# Patient Record
Sex: Male | Born: 1960 | Race: Black or African American | Hispanic: No | Marital: Single | Smoking: Former smoker
Health system: Southern US, Community
[De-identification: ages and names within clinical notes are randomized; demographics above are authoritative.]

## PROBLEM LIST (undated history)

## (undated) DIAGNOSIS — I10 Essential (primary) hypertension: Secondary | ICD-10-CM

## (undated) DIAGNOSIS — R0600 Dyspnea, unspecified: Secondary | ICD-10-CM

## (undated) DIAGNOSIS — J449 Chronic obstructive pulmonary disease, unspecified: Secondary | ICD-10-CM

## (undated) DIAGNOSIS — R569 Unspecified convulsions: Secondary | ICD-10-CM

## (undated) DIAGNOSIS — G43909 Migraine, unspecified, not intractable, without status migrainosus: Secondary | ICD-10-CM

## (undated) DIAGNOSIS — G9389 Other specified disorders of brain: Secondary | ICD-10-CM

## (undated) DIAGNOSIS — J45909 Unspecified asthma, uncomplicated: Secondary | ICD-10-CM

## (undated) DIAGNOSIS — E785 Hyperlipidemia, unspecified: Secondary | ICD-10-CM

## (undated) HISTORY — DX: Other specified disorders of brain: G93.89

## (undated) HISTORY — DX: Essential (primary) hypertension: I10

## (undated) HISTORY — DX: Unspecified convulsions: R56.9

## (undated) HISTORY — PX: CARDIOVASCULAR STRESS TEST: SHX262

## (undated) HISTORY — DX: Hyperlipidemia, unspecified: E78.5

## (undated) HISTORY — DX: Migraine, unspecified, not intractable, without status migrainosus: G43.909

---

## 2000-09-16 ENCOUNTER — Emergency Department (HOSPITAL_COMMUNITY): Admission: EM | Admit: 2000-09-16 | Discharge: 2000-09-16 | Payer: Self-pay | Admitting: Emergency Medicine

## 2003-11-03 ENCOUNTER — Emergency Department (HOSPITAL_COMMUNITY): Admission: EM | Admit: 2003-11-03 | Discharge: 2003-11-03 | Payer: Self-pay | Admitting: Emergency Medicine

## 2004-09-20 ENCOUNTER — Encounter: Admission: RE | Admit: 2004-09-20 | Discharge: 2004-09-20 | Payer: Self-pay | Admitting: Emergency Medicine

## 2004-09-22 ENCOUNTER — Ambulatory Visit (HOSPITAL_COMMUNITY): Admission: RE | Admit: 2004-09-22 | Discharge: 2004-09-22 | Payer: Self-pay | Admitting: Emergency Medicine

## 2004-12-08 ENCOUNTER — Emergency Department (HOSPITAL_COMMUNITY): Admission: EM | Admit: 2004-12-08 | Discharge: 2004-12-08 | Payer: Self-pay | Admitting: Emergency Medicine

## 2008-08-18 ENCOUNTER — Ambulatory Visit (HOSPITAL_COMMUNITY): Admission: RE | Admit: 2008-08-18 | Discharge: 2008-08-18 | Payer: Self-pay | Admitting: *Deleted

## 2010-02-10 ENCOUNTER — Ambulatory Visit: Payer: Self-pay | Admitting: Cardiology

## 2010-02-10 ENCOUNTER — Observation Stay (HOSPITAL_COMMUNITY): Admission: EM | Admit: 2010-02-10 | Discharge: 2010-02-12 | Payer: Self-pay | Admitting: Emergency Medicine

## 2010-10-08 LAB — CBC
HCT: 46.5 % (ref 39.0–52.0)
MCHC: 35.1 g/dL (ref 30.0–36.0)
Platelets: 175 10*3/uL (ref 150–400)
RDW: 13.6 % (ref 11.5–15.5)
WBC: 8.4 10*3/uL (ref 4.0–10.5)

## 2010-10-08 LAB — CK TOTAL AND CKMB (NOT AT ARMC)
Relative Index: INVALID (ref 0.0–2.5)
Total CK: 73 U/L (ref 7–232)

## 2010-10-08 LAB — DIFFERENTIAL
Lymphs Abs: 3.2 10*3/uL (ref 0.7–4.0)
Monocytes Absolute: 0.8 10*3/uL (ref 0.1–1.0)
Monocytes Relative: 10 % (ref 3–12)
Neutro Abs: 4.2 10*3/uL (ref 1.7–7.7)
Neutrophils Relative %: 50 % (ref 43–77)

## 2010-10-08 LAB — POCT CARDIAC MARKERS
CKMB, poc: <1 ng/mL — ABNORMAL LOW (ref 1.0–8.0)
Myoglobin, poc: 46.2 ng/mL (ref 12–200)

## 2010-10-08 LAB — COMPREHENSIVE METABOLIC PANEL
ALT: 16 U/L (ref 0–53)
Albumin: 3.8 g/dL (ref 3.5–5.2)
BUN: 6 mg/dL (ref 6–23)
Calcium: 9.6 mg/dL (ref 8.4–10.5)
Glucose, Bld: 90 mg/dL (ref 70–99)
Sodium: 140 mEq/L (ref 135–145)
Total Protein: 6.8 g/dL (ref 6.0–8.3)

## 2010-10-08 LAB — CARDIAC PANEL(CRET KIN+CKTOT+MB+TROPI)
CK, MB: 0.8 ng/mL (ref 0.3–4.0)
Relative Index: INVALID (ref 0.0–2.5)
Total CK: 68 U/L (ref 7–232)
Total CK: 92 U/L (ref 7–232)
Troponin I: 0.02 ng/mL (ref 0.00–0.06)

## 2010-11-07 LAB — LIPID PANEL
Cholesterol: 218 mg/dL — ABNORMAL HIGH (ref 0–200)
HDL: 35 mg/dL — ABNORMAL LOW (ref >39)
LDL Cholesterol: 147 mg/dL — ABNORMAL HIGH (ref 0–99)
Triglycerides: 181 mg/dL — ABNORMAL HIGH (ref <150)
VLDL: 28 mg/dL (ref 0–40)

## 2010-12-06 NOTE — Cardiovascular Report (Signed)
NAMEXAVIOUS, SHARRAR              ACCOUNT NO.:  [id number]   MEDICAL RECORD NO.:  [id number]          PATIENT TYPE:  OIB   LOCATION:  2899                         FACILITY:  [facility]   PHYSICIAN:  Elmore Guise., M.D.DATE OF BIRTH:  [birth year]   DATE OF PROCEDURE:  DATE OF DISCHARGE:  08/18/2008                            CARDIAC CATHETERIZATION   INDICATION FOR PROCEDURE:  Continued chest pain, family history of early  heart disease.   PROCEDURE DESCRIPTION:  The patient was brought to the cardiac cath lab.  After appropriate informed consent, he was prepped and draped in sterile  fashion.  Approximately, 10 mL of 1% lidocaine was used for local  anesthesia.  A 5-French sheath placed in the right femoral artery  without difficulty.  Coronary angiography, LV angiography, aortic root  angiography were then performed.  The patient tolerated the procedure  well and was transferred from the cardiac cath lab in stable condition.   FINDINGS:  1. Left Main:  Normal.  2. LAD:  Normal.  3. D1:  Normal.  4. Ramus intermedius:  Large vessel, normal appearing.  5. LCx:  Codominant, normal appearing.  6. RCA:  Anterior takeoff, mid 20-30% stenosis at a bend, distal RCA      and PDA showed mild luminal irregularities.  7. LV:  EF of 65-70%, no wall motion abnormalities, LVEDP is 16 mmHg.  8. Aortic root shot is normal.  No evidence of enlargement or      dissection noted.   IMPRESSION:  1. Nonobstructive coronary arteries.  2. Vigorous left ventricular systolic function, ejection fraction of      65-70%.  3. Normal-appearing aortic root.   PLAN:  At this time, I would recommend aggressive risk factor  modification as indicated.      Elmore Guise., M.D.  Electronically Signed     TWK/MEDQ  D:  08/18/2008  T:  08/19/2008  Job:  [id number]

## 2010-12-09 NOTE — Procedures (Signed)
REFERRING PHYSICIAN:  Dr. Roque Lias   CLINICAL INFORMATION:  This is a 50 year old African-American male who had  complained of numbness in his left arm and left side of his face with onset  approximately 2 months ago, the last episode occurring the night prior to  this EEG lasting for 45 seconds.   TECHNICAL DESCRIPTION:  This EEG was recorded during the awake state.  The  background activity shows low voltage fast beta activity and muscle artifact  superimposed on well-formed 10 Hz rhythms with higher amplitudes seen in the  posterior head regions bilaterally.  No focal asymmetry and no epileptiform  activity was seen on this study.   Photic stimulation was performed which did not produce a driving response in  occipital head regions.  Hyperventilation testing was performed which  produced no abnormalities.   IMPRESSION:  This is a normal EEG during the awake state without any stage  II sleep present.  There is much low voltage fast beta activity suggestive  of medication effect.      EAV:WUJW  D:  09/22/2004 17:42:23  T:  09/22/2004 20:57:37  Job #:  [id number]

## 2011-12-23 ENCOUNTER — Encounter: Payer: Self-pay | Admitting: *Deleted

## 2012-03-06 ENCOUNTER — Encounter (HOSPITAL_COMMUNITY): Payer: Self-pay | Admitting: Emergency Medicine

## 2012-03-06 ENCOUNTER — Emergency Department (HOSPITAL_COMMUNITY): Payer: Self-pay

## 2012-03-06 ENCOUNTER — Emergency Department (HOSPITAL_COMMUNITY)
Admission: EM | Admit: 2012-03-06 | Discharge: 2012-03-07 | Disposition: A | Discharge status: Home / Self Care | Payer: Self-pay | Attending: Emergency Medicine | Admitting: Emergency Medicine

## 2012-03-06 DIAGNOSIS — F17200 Nicotine dependence, unspecified, uncomplicated: Secondary | ICD-10-CM | POA: Insufficient documentation

## 2012-03-06 DIAGNOSIS — I10 Essential (primary) hypertension: Secondary | ICD-10-CM | POA: Insufficient documentation

## 2012-03-06 DIAGNOSIS — Z79899 Other long term (current) drug therapy: Secondary | ICD-10-CM | POA: Insufficient documentation

## 2012-03-06 DIAGNOSIS — R0789 Other chest pain: Secondary | ICD-10-CM

## 2012-03-06 DIAGNOSIS — R071 Chest pain on breathing: Secondary | ICD-10-CM | POA: Insufficient documentation

## 2012-03-06 DIAGNOSIS — R079 Chest pain, unspecified: Secondary | ICD-10-CM | POA: Insufficient documentation

## 2012-03-06 LAB — BASIC METABOLIC PANEL
Calcium: 9.8 mg/dL (ref 8.4–10.5)
GFR calc non Af Amer: >90 mL/min (ref >90)
Potassium: 3.5 mEq/L (ref 3.5–5.1)
Sodium: 140 mEq/L (ref 135–145)

## 2012-03-06 LAB — CBC
MCH: 34.5 pg — ABNORMAL HIGH (ref 26.0–34.0)
MCHC: 36.2 g/dL — ABNORMAL HIGH (ref 30.0–36.0)
Platelets: 177 10*3/uL (ref 150–400)
RBC: 4.84 MIL/uL (ref 4.22–5.81)

## 2012-03-06 LAB — POCT I-STAT TROPONIN I: Troponin i, poc: 0 ng/mL (ref 0.00–0.08)

## 2012-03-06 MED ORDER — FENTANYL CITRATE 0.05 MG/ML IJ SOLN
50.0000 ug | Freq: Once | INTRAMUSCULAR | Status: AC
  Administered 2012-03-06: 50 ug via INTRAVENOUS
  Filled 2012-03-06: qty 2

## 2012-03-06 NOTE — ED Notes (Signed)
Pt c/o R sided chest pain radiating through to shoulder x 2 days, pressure, pt resting at onset. Pt c/o SOB and lightheadedness with pain. Denies diaphoresis or nausea. EKG completed prior to triage

## 2012-03-06 NOTE — ED Notes (Addendum)
Pt has severe intermittent sharp colicky pains in his right upper chest. Pt rates pain 10/10. Pt leans forward and stretches his chest to help with the pain. Fentanyl ordered per pain protocol

## 2012-03-06 NOTE — ED Notes (Addendum)
Pt states he took 3 baby asa at 5 pm today. Pt states pain worsens when he lying down and taking in deep breaths. Pt reports pallitation to pain when he is sitting up or stretching his chest

## 2012-03-06 NOTE — ED Provider Notes (Addendum)
History     CSN: [id number]  Arrival date & time 03/06/12  4098   First MD Initiated Contact with Patient 03/06/12 2105      Chief Complaint  Patient presents with  . Chest Pain    (Consider location/radiation/quality/duration/timing/severity/associated sxs/prior treatment) HPI Comments: Andrew Villarreal presents for evaluation of right-sided chest pain.  He states the pain began while he was lying in bed yesterday.  He describes the sudden onset of a severe cramping that seemed to radiate into the shoulder.  The pain was relieved when he would sit upright.  Since it's onset, he describes multiple episodes of similar pain.  He denies syncope, palpitations, SOB, abd pain, NVD.  Patient is a 51 y.o. male presenting with chest pain. The history is provided by the patient. No language interpreter was used.  Chest Pain The chest pain began 2 days ago. Chest pain occurs intermittently. The chest pain is unchanged (relapsing spastic pain). At its most intense, the pain is at 8/10. The quality of the pain is described as tightness, sharp and aching ("cramping"). The pain radiates to the right shoulder. Chest pain is worsened by certain positions. Pertinent negatives for primary symptoms include no fever, no fatigue, no syncope, no shortness of breath, no cough, no palpitations, no abdominal pain, no nausea, no vomiting and no altered mental status.  Pertinent negatives for associated symptoms include no claudication, no diaphoresis, no lower extremity edema, no near-syncope, no orthopnea and no weakness. He tried NSAIDs for the symptoms. Risk factors include smoking/tobacco exposure and male gender.  His past medical history is significant for hyperlipidemia and hypertension.  His family medical history is significant for heart disease in family, hyperlipidemia in family and hypertension in family.  Procedure history is positive for cardiac catheterization and exercise treadmill test.     Past Medical  History  Diagnosis Date  . HTN (hypertension)   . Seizures   . Migraine   . Right frontal lobe mass     cyst    Past Surgical History  Procedure Date  . Cardiac catheterization 2010    normal-appearing left system with non-obstructive disease in his mid right coronary artery with worst stenosis 20-30% EF 65-70%  . Cardiovascular stress test     normal    Family History  Problem Relation Age of Onset  . Cancer Father   . Hypertension Sister   . Heart attack Father     History  Substance Use Topics  . Smoking status: Smoker, Current Status Unknown -- 1.0 packs/day    Types: Cigarettes  . Smokeless tobacco: Never Used  . Alcohol Use: No      Review of Systems  Constitutional: Negative for fever, diaphoresis and fatigue.  HENT: Negative.   Eyes: Negative.   Respiratory: Negative for cough and shortness of breath.   Cardiovascular: Positive for chest pain. Negative for palpitations, orthopnea, claudication, syncope and near-syncope.  Gastrointestinal: Negative for nausea, vomiting and abdominal pain.  Genitourinary: Negative.   Musculoskeletal: Negative for myalgias, back pain, joint swelling and arthralgias.  Neurological: Negative.  Negative for weakness.  Psychiatric/Behavioral: Negative.  Negative for altered mental status.    Allergies  Review of patient's allergies indicates no known allergies.  Home Medications   Current Outpatient Rx  Name Route Sig Dispense Refill  . ASPIRIN 81 MG PO TABS Oral Take 81 mg by mouth daily.    Marland Kitchen LISINOPRIL 5 MG PO TABS Oral Take 5 mg by mouth daily.    Marland Kitchen  NAPROXEN 375 MG PO TABS Oral Take 375 mg by mouth 2 (two) times daily with a meal.    . NICOTINE POLACRILEX 4 MG MT GUM Oral Take 4 mg by mouth as needed. NICOTINE DEPENDENCE      BP 165/101  Pulse 68  Temp 98.3 F (36.8 C) (Oral)  Resp 16  Ht 5\' 10"  (1.778 m)  Wt 175 lb (79.379 kg)  BMI 25.11 kg/m2  SpO2 98%  Physical Exam  Constitutional: He is oriented to  person, place, and time. He appears well-developed and well-nourished. No distress. He is not intubated.  HENT:  Head: Normocephalic and atraumatic.  Right Ear: External ear normal.  Left Ear: External ear normal.  Nose: Nose normal.  Mouth/Throat: Oropharynx is clear and moist. No oropharyngeal exudate.  Eyes: Conjunctivae and EOM are normal. Pupils are equal, round, and reactive to light. Right eye exhibits no discharge. Left eye exhibits no discharge. No scleral icterus.  Neck: Normal range of motion. Neck supple. No JVD present. No tracheal deviation present.  Cardiovascular: Normal rate, regular rhythm, S1 normal, S2 normal, normal heart sounds, intact distal pulses and normal pulses.   No extrasystoles are present. PMI is not displaced.  Exam reveals no gallop, no S3, no S4, no distant heart sounds and no friction rub.   No murmur heard. Pulmonary/Chest: Effort normal and breath sounds normal. No accessory muscle usage or stridor. No apnea, not tachypneic and not bradypneic. He is not intubated. No respiratory distress. He has no wheezes. He has no rales. Chest wall is not dull to percussion. He exhibits tenderness (right sided chest wall tenderness to palpation.  no chest wall deformity noted, no splinting or flail). He exhibits no mass, no laceration, no crepitus, no edema, no deformity, no swelling and no retraction. Right breast exhibits no inverted nipple. Left breast exhibits no inverted nipple.  Abdominal: Soft. Bowel sounds are normal. He exhibits no distension and no mass. There is no tenderness. There is no rebound and no guarding.  Musculoskeletal: Normal range of motion. He exhibits no edema and no tenderness.  Lymphadenopathy:    He has no cervical adenopathy.  Neurological: He is alert and oriented to person, place, and time. No cranial nerve deficit. Coordination normal.  Skin: Skin is warm and dry. No rash noted. He is not diaphoretic. No erythema. No pallor.  Psychiatric: He  has a normal mood and affect. His behavior is normal. Judgment and thought content normal.    ED Course  Procedures (including critical care time)  Labs Reviewed  CBC - Abnormal; Notable for the following:    WBC 11.2 (*)     MCH 34.5 (*)     MCHC 36.2 (*)     All other components within normal limits  POCT I-STAT TROPONIN I  BASIC METABOLIC PANEL   No results found.   No diagnosis found.   Date: 03/06/2012  Rate:  69 bpm  Rhythm: sinus  QRS Axis: normal  Intervals: PR prolonged  ST/T Wave abnormalities: none  Conduction Disutrbances:first-degree A-V block   Narrative Interpretation:   Old EKG Reviewed: unchanged      MDM  Pt presents for evaluation or right-sided chest pain.  It has been intermittent over the last 48 hours and is reproducible by palpation of the chest wall.  He does have risk factors for CAD.  Will obtain trop x 2 and perform a routine ER CP evaluation.  If the evaluation is negative, will encourage outpatient follow-up with  his PMD and cardiologist.  0025.  Second troponin is negative.  Pt states that he is having some continued pain.  It continues to be reproducible on exam.  Although he has risk factors for cardiovascular disease, the pain he has tonight does not appear to be secondary to a cardiac cause.  He had a 2010 cardiac cath at [facility] that demonstrated some mild nonobstructive coronary artery disease.  Plan symptomatic care and close outpt f/u with a cardiologist.       Tobin Chad, MD 03/07/12 8119  Tobin Chad, MD 03/07/12 1478

## 2012-03-07 MED ORDER — OXYCODONE-ACETAMINOPHEN 5-325 MG PO TABS: 1.0000 | ORAL_TABLET | Freq: Four times a day (QID) | ORAL | Status: AC | PRN

## 2012-03-07 MED ORDER — HYDROCODONE-ACETAMINOPHEN 5-500 MG PO TABS: 1.0000 | ORAL_TABLET | Freq: Four times a day (QID) | ORAL | Status: AC | PRN

## 2012-06-11 ENCOUNTER — Encounter: Payer: Self-pay | Admitting: *Deleted

## 2014-09-02 ENCOUNTER — Emergency Department (HOSPITAL_COMMUNITY): Payer: Self-pay

## 2014-09-02 ENCOUNTER — Emergency Department (HOSPITAL_COMMUNITY)
Admission: EM | Admit: 2014-09-02 | Discharge: 2014-09-02 | Disposition: A | Discharge status: Home / Self Care | Payer: Self-pay | Attending: Emergency Medicine | Admitting: Emergency Medicine

## 2014-09-02 ENCOUNTER — Encounter (HOSPITAL_COMMUNITY): Payer: Self-pay | Admitting: Neurology

## 2014-09-02 DIAGNOSIS — I10 Essential (primary) hypertension: Secondary | ICD-10-CM | POA: Insufficient documentation

## 2014-09-02 DIAGNOSIS — Z720 Tobacco use: Secondary | ICD-10-CM | POA: Insufficient documentation

## 2014-09-02 DIAGNOSIS — Z7982 Long term (current) use of aspirin: Secondary | ICD-10-CM | POA: Insufficient documentation

## 2014-09-02 DIAGNOSIS — R079 Chest pain, unspecified: Secondary | ICD-10-CM

## 2014-09-02 DIAGNOSIS — Z791 Long term (current) use of non-steroidal anti-inflammatories (NSAID): Secondary | ICD-10-CM | POA: Insufficient documentation

## 2014-09-02 DIAGNOSIS — Z8669 Personal history of other diseases of the nervous system and sense organs: Secondary | ICD-10-CM | POA: Insufficient documentation

## 2014-09-02 DIAGNOSIS — Z9889 Other specified postprocedural states: Secondary | ICD-10-CM | POA: Insufficient documentation

## 2014-09-02 DIAGNOSIS — R0789 Other chest pain: Secondary | ICD-10-CM | POA: Insufficient documentation

## 2014-09-02 LAB — I-STAT TROPONIN, ED
Troponin i, poc: 0 ng/mL (ref 0.00–0.08)
Troponin i, poc: 0 ng/mL (ref 0.00–0.08)

## 2014-09-02 LAB — BASIC METABOLIC PANEL
Anion gap: 6 (ref 5–15)
BUN: 7 mg/dL (ref 6–23)
CALCIUM: 9.8 mg/dL (ref 8.4–10.5)
CO2: 25 mmol/L (ref 19–32)
Chloride: 108 mmol/L (ref 96–112)
Creatinine, Ser: 0.91 mg/dL (ref 0.50–1.35)
GLUCOSE: 92 mg/dL (ref 70–99)
POTASSIUM: 3.8 mmol/L (ref 3.5–5.1)
SODIUM: 139 mmol/L (ref 135–145)

## 2014-09-02 LAB — CBC
HCT: 46.6 % (ref 39.0–52.0)
Hemoglobin: 16.3 g/dL (ref 13.0–17.0)
MCH: 34.1 pg — ABNORMAL HIGH (ref 26.0–34.0)
MCHC: 35 g/dL (ref 30.0–36.0)
MCV: 97.5 fL (ref 78.0–100.0)
PLATELETS: 215 10*3/uL (ref 150–400)
RBC: 4.78 MIL/uL (ref 4.22–5.81)
RDW: 13.2 % (ref 11.5–15.5)
WBC: 7.6 10*3/uL (ref 4.0–10.5)

## 2014-09-02 LAB — HEPATIC FUNCTION PANEL
ALT: 19 U/L (ref 0–53)
AST: 24 U/L (ref 0–37)
Albumin: 4 g/dL (ref 3.5–5.2)
Alkaline Phosphatase: 55 U/L (ref 39–117)
BILIRUBIN TOTAL: 0.4 mg/dL (ref 0.3–1.2)
Total Protein: 7 g/dL (ref 6.0–8.3)

## 2014-09-02 LAB — LIPASE, BLOOD: LIPASE: 26 U/L (ref 11–59)

## 2014-09-02 MED ORDER — ACETAMINOPHEN 500 MG PO TABS
1000.0000 mg | ORAL_TABLET | Freq: Once | ORAL | Status: AC
  Administered 2014-09-02: 1000 mg via ORAL
  Filled 2014-09-02: qty 2

## 2014-09-02 MED ORDER — PROMETHAZINE HCL 25 MG/ML IJ SOLN
25.0000 mg | Freq: Once | INTRAMUSCULAR | Status: AC
  Administered 2014-09-02: 25 mg via INTRAVENOUS
  Filled 2014-09-02 (×2): qty 1

## 2014-09-02 MED ORDER — MORPHINE SULFATE 4 MG/ML IJ SOLN
4.0000 mg | Freq: Once | INTRAMUSCULAR | Status: AC
  Administered 2014-09-02: 4 mg via INTRAVENOUS
  Filled 2014-09-02: qty 1

## 2014-09-02 MED ORDER — GI COCKTAIL ~~LOC~~
30.0000 mL | Freq: Once | ORAL | Status: AC
  Administered 2014-09-02: 30 mL via ORAL
  Filled 2014-09-02: qty 30

## 2014-09-02 MED ORDER — DIPHENHYDRAMINE HCL 50 MG/ML IJ SOLN
25.0000 mg | Freq: Once | INTRAMUSCULAR | Status: AC
  Administered 2014-09-02: 25 mg via INTRAVENOUS
  Filled 2014-09-02: qty 1

## 2014-09-02 MED ORDER — OXYCODONE-ACETAMINOPHEN 5-325 MG PO TABS: 2.0000 | ORAL_TABLET | Freq: Four times a day (QID) | ORAL | Status: DC | PRN

## 2014-09-02 MED ORDER — ONDANSETRON HCL 4 MG/2ML IJ SOLN
4.0000 mg | Freq: Once | INTRAMUSCULAR | Status: AC
  Administered 2014-09-02: 4 mg via INTRAVENOUS
  Filled 2014-09-02: qty 2

## 2014-09-02 MED ORDER — HYDROCODONE-ACETAMINOPHEN 5-325 MG PO TABS: 1.0000 | ORAL_TABLET | Freq: Four times a day (QID) | ORAL | Status: DC | PRN

## 2014-09-02 MED ORDER — OXYCODONE HCL 5 MG PO TABS
5.0000 mg | ORAL_TABLET | Freq: Once | ORAL | Status: AC
  Administered 2014-09-02: 5 mg via ORAL
  Filled 2014-09-02: qty 1

## 2014-09-02 NOTE — ED Notes (Signed)
After giving zofran-- arm above IV site turned red, hives present

## 2014-09-02 NOTE — ED Provider Notes (Signed)
CSN: [id number]     Arrival date & time 09/02/14  0818 History   First MD Initiated Contact with Patient 09/02/14 [phone]     Chief Complaint  Patient presents with  . Chest Pain     (Consider location/radiation/quality/duration/timing/severity/associated sxs/prior Treatment) HPI Comments: Patient with past medical history of hypertension, migraines, and seizures presents to the emergency department with chief complaint of right-sided chest pain. Patient states that he has had the pain on and off for the past several days. He states that when the pain spikes, it radiates to his back and to the right side of his neck. He denies any associated nausea, vomiting, or diarrhea. He does report some associated shortness of breath and some diaphoresis. There is no exertional component. He states that he thinks pain got a little better after having his morning coffee. He has not tried taking anything to alleviate his symptoms.  The history is provided by the patient. No language interpreter was used.    Past Medical History  Diagnosis Date  . HTN (hypertension)   . Seizures   . Migraine   . Right frontal lobe mass     cyst   Past Surgical History  Procedure Laterality Date  . 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 Cardiovascular stress test      normal   Family History  Problem Relation Age of Onset  . Cancer Father   . Hypertension Sister   . Heart attack Father    History  Substance Use Topics  . Smoking status: Smoker, Current Status Unknown -- 1.00 packs/day    Types: Cigarettes  . Smokeless tobacco: Never Used  . Alcohol Use: No    Review of Systems  Constitutional: Negative for fever and chills.  Respiratory: Negative for shortness of breath.   Cardiovascular: Negative for chest pain.  Gastrointestinal: Negative for nausea, vomiting, diarrhea and  constipation.  Genitourinary: Negative for dysuria.  All other systems reviewed and are negative.     Allergies  Review of patient's allergies indicates no known allergies.  Home Medications   Prior to Admission medications   Medication Sig Start Date End Date Taking? Authorizing Provider  aspirin 81 MG tablet Take 81 mg by mouth daily.    Historical Provider, MD  lisinopril (PRINIVIL,ZESTRIL) 5 MG tablet Take 5 mg by mouth daily.    Historical Provider, MD  naproxen (NAPROSYN) 375 MG tablet Take 375 mg by mouth 2 (two) times daily with a meal.    Historical Provider, MD  nicotine polacrilex (NICORETTE) 4 MG gum Take 4 mg by mouth as needed. NICOTINE DEPENDENCE    Historical Provider, MD   BP 169/96 mmHg  Pulse 77  Temp(Src) 98.5 F (36.9 C) (Oral)  Resp 18  Ht  (1.778 m)  Wt 176 lb (79.833 kg)  BMI 25.25 kg/m2  SpO2 99% Physical Exam  Constitutional: He is oriented to person, place, and time. He appears well-developed and well-nourished.  Appears uncomfortable  HENT:  Head: Normocephalic and atraumatic.  Eyes: Conjunctivae and EOM are normal. Pupils are equal, round, and reactive to light. Right eye exhibits no discharge. Left eye exhibits no discharge. No scleral icterus.  Neck: Normal range of motion. Neck supple. No JVD present.  Cardiovascular: Normal rate, regular rhythm and normal heart sounds.  Exam reveals no gallop and no friction rub.   No murmur heard.  Pulmonary/Chest: Effort normal and breath sounds normal. No respiratory distress. He has no wheezes. He has no rales. He exhibits no tenderness.  Abdominal: Soft. He exhibits no distension and no mass. There is no tenderness. There is no rebound and no guarding.  No focal abdominal tenderness, no RLQ tenderness or pain at McBurney's point, no RUQ tenderness or Murphy's sign, no left-sided abdominal tenderness, no fluid wave, or signs of peritonitis   Musculoskeletal: Normal range of motion. He exhibits no  edema or tenderness.  Neurological: He is alert and oriented to person, place, and time.  Skin: Skin is warm and dry.  Psychiatric: He has a normal mood and affect. His behavior is normal. Judgment and thought content normal.  Nursing note and vitals reviewed.   ED Course  Procedures (including critical care time) Results for orders placed or performed during the hospital encounter of 09/02/14  CBC  Result Value Ref Range   WBC 7.6 4.0 - 10.5 K/uL   RBC 4.78 4.22 - 5.81 MIL/uL   Hemoglobin 16.3 13.0 - 17.0 g/dL   HCT 16.1 09.6 - 04.5 %   MCV 97.5 78.0 - 100.0 fL   MCH 34.1 (H) 26.0 - 34.0 pg   MCHC 35.0 30.0 - 36.0 g/dL   RDW 40.9 81.1 - 91.4 %   Platelets 215 150 - 400 K/uL  Basic metabolic panel  Result Value Ref Range   Sodium 139 135 - 145 mmol/L   Potassium 3.8 3.5 - 5.1 mmol/L   Chloride 108 96 - 112 mmol/L   CO2 25 19 - 32 mmol/L   Glucose, Bld 92 70 - 99 mg/dL   BUN 7 6 - 23 mg/dL   Creatinine, Ser 7.82 0.50 - 1.35 mg/dL   Calcium 9.8 8.4 - 95.6 mg/dL   GFR calc non Af Amer >90 >90 mL/min   GFR calc Af Amer >90 >90 mL/min   Anion gap 6 5 - 15  Hepatic function panel  Result Value Ref Range   Total Protein 7.0 6.0 - 8.3 g/dL   Albumin 4.0 3.5 - 5.2 g/dL   AST 24 0 - 37 U/L   ALT 19 0 - 53 U/L   Alkaline Phosphatase 55 39 - 117 U/L   Total Bilirubin 0.4 0.3 - 1.2 mg/dL   Bilirubin, Direct <2.1 0.0 - 0.5 mg/dL   Indirect Bilirubin NOT CALCULATED 0.3 - 0.9 mg/dL  Lipase, blood  Result Value Ref Range   Lipase 26 11 - 59 U/L  I-stat troponin, ED (not at [facility])  Result Value Ref Range   Troponin i, poc 0.00 0.00 - 0.08 ng/mL   Comment 3          I-stat troponin, ED  Result Value Ref Range   Troponin i, poc 0.00 0.00 - 0.08 ng/mL   Comment 3           Dg Chest 2 View  09/02/2014   CLINICAL DATA:  Sternal chest pain for 1 week, worse today.  EXAM: CHEST  2 VIEW  COMPARISON:  03/06/2012  FINDINGS: The heart size and mediastinal contours are within normal limits.  Both lungs are clear. The visualized skeletal structures are unremarkable.  IMPRESSION: No active cardiopulmonary disease.   Electronically Signed   By: Charlett Nose M.D.   On: 09/02/2014 09:02      EKG Interpretation None      MDM   Final diagnoses:  Chest pain, unspecified chest pain type    Patient with  right-sided chest pain that radiates to his back. Will check labs, treat pain, and will reassess.  9:56 AM Called to bedside by nurse.  Patient having allergic reaction to zofran.  Hives in right antecubital fossa after pushing zofran.  Lungs are clear.  No stridor.  Airway intact.  No oral swelling.  11:31 AM Pain is now a 1/10.  Has a mild headache.  Feels a little nauseated, will give phenergan.  Will recheck troponin.  Patient still feeling well.  Delta troponin is negative.  Low suspicion for ACS.  NO hypoxia or tachycardia.  Doubt PE.  Will discharge to home.  Patient discussed with Dr. Blinda LeatherwoodPollina, who agrees with the plan.  Roxy Horsemanobert Taishaun Levels, PA-C 09/02/14 1400  Gilda Creasehristopher J. Pollina, MD 09/02/14 [phone]

## 2014-09-02 NOTE — ED Notes (Signed)
Placed ice chips in a specimen bag per Clydie BraunKaren, RN for pt's right arm

## 2014-09-02 NOTE — ED Notes (Addendum)
Rash on arm cleared, still a few welts, denies itching at present. Called lab to check on lipase and hepatic panel--

## 2014-09-02 NOTE — ED Notes (Signed)
Pt in xray

## 2014-09-02 NOTE — Discharge Instructions (Signed)

## 2014-09-02 NOTE — ED Notes (Signed)
Pt taken from triage to xray and will be brought to room, D35, when testing is complete

## 2014-09-02 NOTE — ED Notes (Signed)
Pt reports right sided cp for several days, got worse today, radiation to back and right side of neck. No vomiting. Feels sob and had episode of diaphoresis. Reports had cardiac cath 2 years told 40% blockage.

## 2015-11-25 ENCOUNTER — Emergency Department (HOSPITAL_COMMUNITY)
Admission: EM | Admit: 2015-11-25 | Discharge: 2015-11-25 | Disposition: A | Discharge status: Home / Self Care | Payer: Self-pay | Attending: Emergency Medicine | Admitting: Emergency Medicine

## 2015-11-25 ENCOUNTER — Encounter (HOSPITAL_COMMUNITY): Payer: Self-pay | Admitting: Emergency Medicine

## 2015-11-25 DIAGNOSIS — Z7982 Long term (current) use of aspirin: Secondary | ICD-10-CM | POA: Insufficient documentation

## 2015-11-25 DIAGNOSIS — Z791 Long term (current) use of non-steroidal anti-inflammatories (NSAID): Secondary | ICD-10-CM | POA: Insufficient documentation

## 2015-11-25 DIAGNOSIS — Z9889 Other specified postprocedural states: Secondary | ICD-10-CM | POA: Insufficient documentation

## 2015-11-25 DIAGNOSIS — I10 Essential (primary) hypertension: Secondary | ICD-10-CM | POA: Insufficient documentation

## 2015-11-25 DIAGNOSIS — F17210 Nicotine dependence, cigarettes, uncomplicated: Secondary | ICD-10-CM | POA: Insufficient documentation

## 2015-11-25 DIAGNOSIS — Z79899 Other long term (current) drug therapy: Secondary | ICD-10-CM | POA: Insufficient documentation

## 2015-11-25 DIAGNOSIS — G40909 Epilepsy, unspecified, not intractable, without status epilepticus: Secondary | ICD-10-CM | POA: Insufficient documentation

## 2015-11-25 LAB — I-STAT CHEM 8, ED
BUN: 10 mg/dL (ref 6–20)
CREATININE: 0.8 mg/dL (ref 0.61–1.24)
Calcium, Ion: 1.21 mmol/L (ref 1.12–1.23)
Chloride: 104 mmol/L (ref 101–111)
Glucose, Bld: 100 mg/dL — ABNORMAL HIGH (ref 65–99)
HEMATOCRIT: 50 % (ref 39.0–52.0)
HEMOGLOBIN: 17 g/dL (ref 13.0–17.0)
POTASSIUM: 3.6 mmol/L (ref 3.5–5.1)
SODIUM: 143 mmol/L (ref 135–145)
TCO2: 26 mmol/L (ref 0–100)

## 2015-11-25 MED ORDER — LEVETIRACETAM 500 MG PO TABS
500.0000 mg | ORAL_TABLET | Freq: Once | ORAL | Status: AC
  Administered 2015-11-25: 500 mg via ORAL
  Filled 2015-11-25: qty 1

## 2015-11-25 NOTE — ED Provider Notes (Signed)
CSN: [id number]     Arrival date & time 11/25/15  [id number] History   First MD Initiated Contact with Patient 11/25/15 [phone]     Chief Complaint  Patient presents with  . Seizures    (Consider location/radiation/quality/duration/timing/severity/associated sxs/prior Treatment) Patient is a 55 y.o. male presenting with seizures. The history is provided by the patient and a friend.  Seizures Seizure activity on arrival: no   Seizure type:  Grand mal Preceding symptoms: aura ("cold hair dryer" sensation over head)   Episode characteristics: confusion and disorientation   Postictal symptoms: confusion and somnolence   Context: change in medication (stopped depakote and added keppra d/t side effects- now on 500 mg BID), decreased sleep (last night) and stress (about work, [employer], and disability)   Context: not fever and medical compliance   Context comment:  Arguing on the phone with his wife just prior to episode Recent head injury:  No recent head injuries PTA treatment:  None History of seizures: yes (since 2006)   Date of most recent prior episode:  2 days ago Severity:  Moderate Seizure control level:  Well controlled Compliance with current therapy:  Good ([facility])   Past Medical History  Diagnosis Date  . HTN (hypertension)   . Seizures (HCC)   . Migraine   . Right frontal lobe mass     cyst   Past Surgical History  Procedure Laterality Date  . 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 Cardiovascular stress test      normal   Family History  Problem Relation Age of Onset  . Cancer Father   . Hypertension Sister   . Heart attack Father    Social History  Substance Use Topics  . Smoking status: Smoker, Current Status Unknown -- 1.00 packs/day    Types: Cigarettes  . Smokeless tobacco: Never Used  . Alcohol Use: No     Review of Systems  Neurological: Positive for seizures.  All other systems reviewed and are negative.     Allergies  Zofran  Home Medications   Prior to Admission medications   Medication Sig Start Date End Date Taking? Authorizing Provider  aspirin 81 MG tablet Take 81 mg by mouth daily.    Historical Provider, MD  HYDROcodone-acetaminophen (NORCO/VICODIN) 5-325 MG per tablet Take 1 tablet by mouth every 6 (six) hours as needed. 09/02/14   Roxy Horsemanobert Browning, PA-C  lisinopril (PRINIVIL,ZESTRIL) 5 MG tablet Take 5 mg by mouth daily.    Historical Provider, MD  naproxen (NAPROSYN) 375 MG tablet Take 375 mg by mouth 2 (two) times daily with a meal.    Historical Provider, MD   BP 158/107 mmHg  Pulse 64  Temp(Src) 98.2 F (36.8 C) (Oral)  Resp 18  SpO2 97% Physical Exam  Constitutional: He is oriented to person, place, and time. He appears well-developed and well-nourished. No distress.  HENT:  Head: Normocephalic and atraumatic.  Eyes: Conjunctivae are normal.  Neck: Neck supple. No tracheal deviation present.  Cardiovascular: Normal rate, regular rhythm and normal heart sounds.   Pulmonary/Chest: Effort normal and breath sounds normal. No respiratory distress.  Abdominal: Soft. He exhibits no distension. There is no tenderness.  Neurological: He is alert and oriented to person, place, and time. No cranial nerve deficit. Coordination normal.  Normal finger to nose testing and rapid alternating movement. Heel to shin  normal  Skin: Skin is warm and dry.  Psychiatric: He has a normal mood and affect.  Vitals reviewed.   ED Course  Procedures (including critical care time) Labs Review Labs Reviewed - No data to display  Imaging Review No results found. I have personally reviewed and evaluated these images and lab results as part of my medical decision-making.   EKG Interpretation   Date/Time:  Thursday Nov 25 2015 06:54:54 EDT Ventricular Rate:  60 PR Interval:   228 QRS Duration: 87 QT Interval:  404 QTC Calculation: 404 R Axis:   15 Text Interpretation:  Sinus rhythm Prolonged PR interval RSR' in V1 or V2,  right VCD or RVH No significant change since last tracing Confirmed by  Jae Skeet MD, Hector Venne ([id number]) on 11/25/2015 7:01:11 AM      MDM   Final diagnoses:  Seizure disorder (HCC)    55 y.o. male presents with seizure episode that occurred this morning. Has h/o migraines and had one last night, had diffuculty sleeping. Was on the phone arguing with his wife and went into seizure, was found post-ictal by EMS and has had full recovery. Has been compliant with keppra. He has used a trigeminal nerve stimulator for migraines and has had seizures both times he used it including today. Recommended discontinuation until he is able to f/u with neurology. No significant hematologic or metabolic abnormalities to explain symptoms. Plan to follow up with PCP as needed and return precautions discussed for worsening or new concerning symptoms.     Lyndal Pulley, MD 11/25/15 [phone]

## 2015-11-25 NOTE — Discharge Instructions (Signed)

## 2015-11-25 NOTE — ED Notes (Signed)
Per EMS, pt was talking to his wife via the phone when he began having a seizure at which time she called EMS.  Fire had to enter through a window to gain access and found pt b/t the wall and his bed.  He was postdictil however was "back to normal upon arriving at the hospital."  He is now complaining of lumbar pain and a migraine.  Pt does have hx of seizures and is on Kepra and is also being treated for a brain tumor.

## 2016-01-14 ENCOUNTER — Emergency Department (HOSPITAL_COMMUNITY): Payer: Self-pay

## 2016-01-14 ENCOUNTER — Emergency Department (HOSPITAL_COMMUNITY)
Admission: EM | Admit: 2016-01-14 | Discharge: 2016-01-14 | Disposition: A | Discharge status: Home / Self Care | Payer: Self-pay | Attending: Emergency Medicine | Admitting: Emergency Medicine

## 2016-01-14 ENCOUNTER — Encounter (HOSPITAL_COMMUNITY): Payer: Self-pay | Admitting: *Deleted

## 2016-01-14 DIAGNOSIS — R51 Headache: Secondary | ICD-10-CM | POA: Insufficient documentation

## 2016-01-14 DIAGNOSIS — I10 Essential (primary) hypertension: Secondary | ICD-10-CM | POA: Insufficient documentation

## 2016-01-14 DIAGNOSIS — Z7982 Long term (current) use of aspirin: Secondary | ICD-10-CM | POA: Insufficient documentation

## 2016-01-14 DIAGNOSIS — R519 Headache, unspecified: Secondary | ICD-10-CM

## 2016-01-14 LAB — CBC
HCT: 46.7 % (ref 39.0–52.0)
HEMOGLOBIN: 15.7 g/dL (ref 13.0–17.0)
MCH: 32.2 pg (ref 26.0–34.0)
MCHC: 33.6 g/dL (ref 30.0–36.0)
MCV: 95.7 fL (ref 78.0–100.0)
Platelets: 203 10*3/uL (ref 150–400)
RBC: 4.88 MIL/uL (ref 4.22–5.81)
RDW: 12.9 % (ref 11.5–15.5)
WBC: 8.7 10*3/uL (ref 4.0–10.5)

## 2016-01-14 LAB — BASIC METABOLIC PANEL
Anion gap: 6 (ref 5–15)
BUN: 5 mg/dL — ABNORMAL LOW (ref 6–20)
CALCIUM: 9.5 mg/dL (ref 8.9–10.3)
CHLORIDE: 109 mmol/L (ref 101–111)
CO2: 25 mmol/L (ref 22–32)
Creatinine, Ser: 0.93 mg/dL (ref 0.61–1.24)
GFR calc non Af Amer: >60 mL/min (ref >60)
Glucose, Bld: 93 mg/dL (ref 65–99)
Potassium: 4 mmol/L (ref 3.5–5.1)
SODIUM: 140 mmol/L (ref 135–145)

## 2016-01-14 MED ORDER — DIPHENHYDRAMINE HCL 50 MG/ML IJ SOLN
25.0000 mg | Freq: Once | INTRAMUSCULAR | Status: AC
  Administered 2016-01-14: 25 mg via INTRAVENOUS
  Filled 2016-01-14: qty 1

## 2016-01-14 MED ORDER — METOCLOPRAMIDE HCL 5 MG/ML IJ SOLN
10.0000 mg | Freq: Once | INTRAMUSCULAR | Status: AC
  Administered 2016-01-14: 10 mg via INTRAVENOUS
  Filled 2016-01-14: qty 2

## 2016-01-14 MED ORDER — SODIUM CHLORIDE 0.9 % IV BOLUS (SEPSIS): 500.0000 mL | Freq: Once | INTRAVENOUS | Status: DC

## 2016-01-14 MED ORDER — DEXAMETHASONE SODIUM PHOSPHATE 10 MG/ML IJ SOLN
10.0000 mg | Freq: Once | INTRAMUSCULAR | Status: AC
  Administered 2016-01-14: 10 mg via INTRAVENOUS
  Filled 2016-01-14: qty 1

## 2016-01-14 MED ORDER — PROCHLORPERAZINE EDISYLATE 5 MG/ML IJ SOLN
10.0000 mg | Freq: Once | INTRAMUSCULAR | Status: AC
  Administered 2016-01-14: 10 mg via INTRAVENOUS
  Filled 2016-01-14: qty 2

## 2016-01-14 MED ORDER — KETOROLAC TROMETHAMINE 30 MG/ML IJ SOLN
30.0000 mg | Freq: Once | INTRAMUSCULAR | Status: AC
  Administered 2016-01-14: 30 mg via INTRAVENOUS
  Filled 2016-01-14: qty 1

## 2016-01-14 NOTE — ED Provider Notes (Signed)
CSN: [id number]     Arrival date & time 01/14/16  1047 History   First MD Initiated Contact with Patient 01/14/16 1115     Chief Complaint  Patient presents with  . Headache   (Consider location/radiation/quality/duration/timing/severity/associated sxs/prior Treatment) Patient is a 55 y.o. male presenting with headaches. The history is provided by the patient and medical records. No language interpreter was used.  Headache Associated symptoms: nausea (Resolved) and vomiting (Resolved)   Associated symptoms: no abdominal pain, no back pain, no congestion, no cough, no dizziness, no fever, no neck pain, no neck stiffness and no photophobia    Andrew Villarreal is a 55 y.o. male  with a PMH of seizures, HTN, right frontal lobe cyst, migraine headaches who presents to the Emergency Department complaining of gradually worsening headache x 4 days that "comes in spurts" and is described as a "piercing pain" located on the right side of his head. Pain lasts approx. 30 seconds then resolves. Will then reoccur a few seconds later. Took excederin, tylenol #3, and ibuprofen at home with little relief. Associated with nausea and vomiting yesterday which has since resolved. Also admits to intermittent right sided blurry vision. Patient states pain today is more severe than his typical headaches, and that his headaches normally resolve with over-the-counter medications. No alleviating or aggravating factors noted. Took his blood pressure medication and 81 asa today as well. Denies fever, chest pain, back pain, dizziness, syncopal episode.  Past Medical History  Diagnosis Date  . HTN (hypertension)   . Seizures (HCC)   . Migraine   . Right frontal lobe mass     cyst   Past Surgical History  Procedure Laterality Date  . 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 Cardiovascular stress test       normal   Family History  Problem Relation Age of Onset  . Cancer Father   . Hypertension Sister   . Heart attack Father    Social History  Substance Use Topics  . Smoking status: Smoker, Current Status Unknown -- 1.00 packs/day    Types: Cigarettes  . Smokeless tobacco: Never Used  . Alcohol Use: No    Review of Systems  Constitutional: Negative for fever and chills.  HENT: Negative for congestion.   Eyes: Positive for visual disturbance. Negative for photophobia.  Respiratory: Negative for cough and shortness of breath.   Cardiovascular: Negative.   Gastrointestinal: Positive for nausea (Resolved) and vomiting (Resolved). Negative for abdominal pain.  Genitourinary: Negative for dysuria.  Musculoskeletal: Negative for back pain, neck pain and neck stiffness.  Skin: Negative for rash.  Neurological: Positive for headaches. Negative for dizziness and syncope.      Allergies  Clonazepam and Zofran  Home Medications   Prior to Admission medications   Medication Sig Start Date End Date Taking? Authorizing Provider  aspirin 81 MG tablet Take 81 mg by mouth daily.    Historical Provider, MD  HYDROcodone-acetaminophen (NORCO/VICODIN) 5-325 MG per tablet Take 1 tablet by mouth every 6 (six) hours as needed. 09/02/14   Roxy Horsemanobert Browning, PA-C  lisinopril (PRINIVIL,ZESTRIL) 5 MG tablet Take 5 mg by mouth daily.    Historical Provider, MD  naproxen (NAPROSYN) 375 MG tablet Take 375 mg by mouth 2 (two) times daily with a meal.    Historical Provider, MD   BP 148/94 mmHg  Pulse 43  Temp(Src) 98.4  F (36.9 C) (Oral)  Resp 18  Ht  (1.778 m)  Wt 79.379 kg  BMI 25.11 kg/m2  SpO2 97% Physical Exam  Constitutional: He is oriented to person, place, and time. He appears well-developed and well-nourished. No distress.  HENT:  Head: Normocephalic and atraumatic.  Mouth/Throat: Oropharynx is clear and moist.  No tenderness of the temporal artery   Eyes: Conjunctivae and EOM  are normal. Pupils are equal, round, and reactive to light. No scleral icterus.  No nystagmus   Neck: Normal range of motion. Neck supple.  Full active and passive ROM without pain.  No midline or paraspinal tenderness. No nuchal rigidity or meningeal signs.  Cardiovascular: Normal heart sounds and intact distal pulses.   Slightly brady but regular rhythm on exam.   Pulmonary/Chest: Effort normal and breath sounds normal. No respiratory distress. He has no wheezes. He has no rales.  Abdominal: Soft. Bowel sounds are normal. He exhibits no distension. There is no tenderness. There is no rebound and no guarding.  Musculoskeletal: Normal range of motion.  Lymphadenopathy:    He has no cervical adenopathy.  Neurological: He is alert and oriented to person, place, and time. He has normal reflexes. No cranial nerve deficit. Coordination normal.  Mental Status: Alert, oriented, and thought content is appropriate. Speech is fluent without evidence of aphasia. Able to follow two-step commands without difficulty.  II - Peripheral visual fields grossly normal, pupils equal, round, reactive to light III, IV, VI - Bilateral EOM intact, no ptosis V - Facial light touch sensation intact and equal VII - Facial symmetry: smile, raised eyebrows ; Eyelids kept closed against resistance VIII - Hearing grossly normal bilaterally  IX, X - Uvula midline XI - Bilateral shoulder shrug equal and strong XII - Tongue extension midline 5/5 muscle strength of upper and lower extremities bilaterally including strong and equal grip strength and plantar/dorsiflexion.  Light touch sensory intact.  Normal rapid alternating movements and finger to nose. Normal gait and balance.   Skin: Skin is warm and dry. No rash noted. He is not diaphoretic.  Psychiatric: He has a normal mood and affect. His behavior is normal. Judgment and thought content normal.  Nursing note and vitals reviewed.   ED Course  Procedures (including  critical care time) Labs Review Labs Reviewed  BASIC METABOLIC PANEL - Abnormal; Notable for the following:    BUN 5 (*)    All other components within normal limits  CBC    Imaging Review Ct Head Wo Contrast  01/14/2016  CLINICAL DATA:  Right-sided headache. EXAM: CT HEAD WITHOUT CONTRAST TECHNIQUE: Contiguous axial images were obtained from the base of the skull through the vertex without intravenous contrast. COMPARISON:  No prior head CT.  09/20/2004 brain MRI. FINDINGS: Subcentimeter hypodense round lesion in the medial right temporal lobe (series 2/ image 15) is stable since 09/20/2004 brain MRI. No evidence of parenchymal hemorrhage or extra-axial fluid collection. No new mass lesion, mass effect, or midline shift. No CT evidence of acute infarction. Intracranial atherosclerosis. Cerebral volume is age appropriate. No ventriculomegaly. Mild mucoperiosteal thickening in the bilateral maxillary sinuses, sphenoid sinus, frontal sinus and bilateral ethmoidal air cells. No fluid levels in the visualized paranasal sinuses. The mastoid air cells are unopacified. No evidence of calvarial fracture. IMPRESSION: 1.  No evidence of acute intracranial abnormality. 2. 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 3. Paranasal  sinusitis, probably chronic. Electronically Signed   By: Delbert PhenixJason A Poff M.D.   On: 01/14/2016 12:27   I have personally reviewed and evaluated these images and lab results as part of my medical decision-making.   EKG Interpretation None      MDM   Final diagnoses:  Acute nonintractable headache, unspecified headache type   Wendee BeaversCalvin B Thai is a 55 y.o. male with PMH of right temporal lobe cyst, seizures, migraines, HTN who presents to ED for headache. No focal neuro deficits on exam. CBC, BMP reassuring. Visual acuity screening reassuring. CT head with no acute  abnormalities, stable lesion of the right temporal lobe compared to prior image in 2006. Migraine cocktail given.   2:15 PM - Patient re-evaluated and feels much improved. Headache resolved. The patient denies focal numbness/weakness, balance problems, confusion, or speech difficulty. The patient has no clotting risk factors thus venous sinus thrombosis is unlikely.  I feel that the patient is safe for discharge home at this time. PCP or neurology follow up strongly encouraged. I have reviewed return precautions including development of neurologic symptoms, confusion, lethargy, or difficulty speaking and patient has voiced understanding.   [facility] CenterJaime Pilcher Sebastiano Luecke, PA-C 01/14/16 1418  Loren Raceravid Yelverton, MD 01/15/16 [phone]

## 2016-01-14 NOTE — Discharge Instructions (Signed)
1. Medications: continue usual home medications 2. Treatment: rest, drink plenty of fluids, if headache persists take ibuprofen with caffeine 3. Follow Up: Please follow up with your primary doctor or neurologist at the next available appointment for discussion of your diagnoses and further evaluation after today's visit; Please return to the ER for double vision, speech difficulty, gait disturbance, persistent vomiting or other concerns.

## 2016-01-14 NOTE — ED Notes (Signed)
Pt reports having headache to top of his head since Tuesday. Reports hx of cyst on his brain, seizures and migraines. No relief with otc meds and reports n/v yesterday.

## 2016-11-15 ENCOUNTER — Emergency Department (HOSPITAL_COMMUNITY)
Admission: EM | Admit: 2016-11-15 | Discharge: 2016-11-15 | Disposition: A | Discharge status: Home / Self Care | Payer: Self-pay | Attending: Emergency Medicine | Admitting: Emergency Medicine

## 2016-11-15 ENCOUNTER — Emergency Department (HOSPITAL_COMMUNITY): Payer: Self-pay

## 2016-11-15 ENCOUNTER — Encounter (HOSPITAL_COMMUNITY): Payer: Self-pay | Admitting: Emergency Medicine

## 2016-11-15 DIAGNOSIS — R079 Chest pain, unspecified: Secondary | ICD-10-CM

## 2016-11-15 DIAGNOSIS — I10 Essential (primary) hypertension: Secondary | ICD-10-CM | POA: Insufficient documentation

## 2016-11-15 DIAGNOSIS — Z7982 Long term (current) use of aspirin: Secondary | ICD-10-CM | POA: Insufficient documentation

## 2016-11-15 DIAGNOSIS — F17210 Nicotine dependence, cigarettes, uncomplicated: Secondary | ICD-10-CM | POA: Insufficient documentation

## 2016-11-15 DIAGNOSIS — R0789 Other chest pain: Secondary | ICD-10-CM | POA: Insufficient documentation

## 2016-11-15 LAB — I-STAT TROPONIN, ED: Troponin i, poc: 0 ng/mL (ref 0.00–0.08)

## 2016-11-15 LAB — CBC WITH DIFFERENTIAL/PLATELET
BASOS ABS: 0 10*3/uL (ref 0.0–0.1)
Basophils Relative: 0 %
EOS ABS: 0.1 10*3/uL (ref 0.0–0.7)
Eosinophils Relative: 1 %
HCT: 42.4 % (ref 39.0–52.0)
Hemoglobin: 14.2 g/dL (ref 13.0–17.0)
LYMPHS PCT: 22 %
Lymphs Abs: 1.5 10*3/uL (ref 0.7–4.0)
MCH: 32.8 pg (ref 26.0–34.0)
MCHC: 33.5 g/dL (ref 30.0–36.0)
MCV: 97.9 fL (ref 78.0–100.0)
MONO ABS: 0.3 10*3/uL (ref 0.1–1.0)
Monocytes Relative: 4 %
Neutro Abs: 4.9 10*3/uL (ref 1.7–7.7)
Neutrophils Relative %: 73 %
Platelets: 191 10*3/uL (ref 150–400)
RBC: 4.33 MIL/uL (ref 4.22–5.81)
RDW: 13.4 % (ref 11.5–15.5)
WBC: 6.8 10*3/uL (ref 4.0–10.5)

## 2016-11-15 LAB — BASIC METABOLIC PANEL
Anion gap: 8 (ref 5–15)
BUN: 7 mg/dL (ref 6–20)
CO2: 26 mmol/L (ref 22–32)
Calcium: 9 mg/dL (ref 8.9–10.3)
Chloride: 107 mmol/L (ref 101–111)
Creatinine, Ser: 0.9 mg/dL (ref 0.61–1.24)
Glucose, Bld: 102 mg/dL — ABNORMAL HIGH (ref 65–99)
Potassium: 4 mmol/L (ref 3.5–5.1)
SODIUM: 141 mmol/L (ref 135–145)

## 2016-11-15 LAB — D-DIMER, QUANTITATIVE (NOT AT ARMC): D-Dimer, Quant: 0.35 ug/mL-FEU (ref 0.00–0.50)

## 2016-11-15 NOTE — ED Provider Notes (Signed)
[facility] DEPT Provider Note   CSN: [id number] Arrival date & time: 11/15/16  1059     History   Chief Complaint Chief Complaint  Patient presents with  . Chest Pain    HPI CAILLOU MINUS is a 56 y.o. male.  56yo M w/ PMH including HTN, tobacco use, seizures, frontal lobe cyst who p/w chest pain. The patient reports that while at rest last night, he began having chest discomfort which he describes as a pressure. It has been constant since it began and has continued throughout today. The pain is worse when he breathes in cold air and better when he sits up and stretches. He began having worsening tightness this morning at work which did not improve with his inhalers which is why he came in. He reports very mild associated shortness of breath. No nausea or vomiting, mild diaphoresis noted. He reports that he has had intermittent right leg pain for a few days. No recent travel or history of blood clots. No FH heart disease. No cocaine use.   The history is provided by the patient.  Chest Pain      Past Medical History:  Diagnosis Date  . HTN (hypertension)   . Migraine   . Right frontal lobe mass    cyst  . Seizures (HCC)     There are no active problems to display for this patient.   Past Surgical History:  Procedure Laterality Date  . CARDIOVASCULAR STRESS TEST     normal       Home Medications    Prior to Admission medications   Medication Sig Start Date End Date Taking? Authorizing Provider  aspirin 81 MG tablet Take 81 mg by mouth daily.    Historical Provider, MD  HYDROcodone-acetaminophen (NORCO/VICODIN) 5-325 MG per tablet Take 1 tablet by mouth every 6 (six) hours as needed. 09/02/14   Roxy Horseman, PA-C  lisinopril (PRINIVIL,ZESTRIL) 5 MG tablet Take 5 mg by mouth daily.    Historical Provider, MD  naproxen (NAPROSYN) 375 MG tablet Take 375 mg by mouth 2 (two) times daily with a meal.    Historical Provider, MD    Family History Family History   Problem Relation Age of Onset  . Cancer Father   . Heart attack Father   . Hypertension Sister     Social History Social History  Substance Use Topics  . Smoking status: Smoker, Current Status Unknown    Packs/day: 1.00    Types: Cigarettes  . Smokeless tobacco: Never Used  . Alcohol use No     Allergies   Clonazepam and Zofran [ondansetron hcl]   Review of Systems Review of Systems  Cardiovascular: Positive for chest pain.     Physical Exam Updated Vital Signs BP (!) 145/87   Pulse (!) 50   Temp 97.5 F (36.4 C)   Resp 19   Ht  (1.778 m)   Wt 170 lb (77.1 kg)   SpO2 97%   BMI 24.39 kg/m   Physical Exam  Constitutional: He is oriented to person, place, and time. He appears well-developed and well-nourished. No distress.  HENT:  Head: Normocephalic and atraumatic.  Moist mucous membranes  Eyes: Conjunctivae are normal. Pupils are equal, round, and reactive to light.  Neck: Neck supple.  Cardiovascular: Regular rhythm and normal heart sounds.  Bradycardia present.   No murmur heard. Pulmonary/Chest: Effort normal and breath sounds normal.  Abdominal: Soft. Bowel sounds are normal. He exhibits no distension. There is no  tenderness.  Musculoskeletal: He exhibits no edema or tenderness.  Neurological: He is alert and oriented to person, place, and time.  Fluent speech  Skin: Skin is warm and dry.  Psychiatric: He has a normal mood and affect. Judgment normal.  Nursing note and vitals reviewed.    ED Treatments / Results  Labs (all labs ordered are listed, but only abnormal results are displayed) Labs Reviewed  BASIC METABOLIC PANEL - Abnormal; Notable for the following:       Result Value   Glucose, Bld 102 (*)    All other components within normal limits  CBC WITH DIFFERENTIAL/PLATELET  D-DIMER, QUANTITATIVE (NOT AT [facility])  I-STAT TROPOININ, ED    EKG  EKG Interpretation  Date/Time:  Wednesday November 15 2016 11:03:49 EDT Ventricular Rate:   54 PR Interval:    QRS Duration: 89 QT Interval:  449 QTC Calculation: 426 R Axis:   38 Text Interpretation:  Sinus rhythm Abnormal R-wave progression, early transition No significant change since last tracing Confirmed by LITTLE MD, RACHEL [phone]) on 11/15/2016 11:11:18 AM       Radiology Dg Chest 2 View  Result Date: 11/15/2016 CLINICAL DATA:  Chest tightness.  Shortness of breath . EXAM: CHEST  2 VIEW COMPARISON:  09/02/2014 . FINDINGS: Mediastinum hilar structures normal. Mild bibasilar subsegmental atelectasis. Mild infiltrates cannot be excluded. No pleural effusion pneumothorax. Heart size normal. No acute bony abnormality. Slightly prominent air-filled loops of small large bowel noted. Mild adynamic ileus cannot be excluded. IMPRESSION: 1. Mild bibasilar subsegmental atelectasis. Mild infiltrates cannot be excluded. 2. Slightly prominent air-filled loops of small and large bowel noted. Mild adynamic ileus cannot be excluded . Electronically Signed   By: Maisie Fus  Register   On: 11/15/2016 12:46    Procedures Procedures (including critical care time)  Medications Ordered in ED Medications - No data to display   Initial Impression / Assessment and Plan / ED Course  I have reviewed the triage vital signs and the nursing notes.  Pertinent labs & imaging results that were available during my care of the patient were reviewed by me and considered in my medical decision making (see chart for details).    PT w/ Constant chest pressure since last night associated with very mild shortness of breath. Patient had similar episode 2 weeks ago that resolved spontaneously. He states that the pain is better when he stretches and worse when he breathes in cold air. On exam, he was well-appearing, vital signs notable for heart rate in the 50s, otherwise stable. EKG without ischemic changes. Lung sounds are clear. Obtained above lab work which showed negative d-dimer and troponin. Chest x-ray shows  bibasilar atelectasis. Patient has had no infectious symptoms to suggest pneumonia including no fever or cough. Chest x-ray also noted some air-filled loops of small and large bowel. Patient has had no vomiting and reports normal bowel movements. I feel that the description of his chest pain is very atypical and given his age, I feel he is safe for discharge. He has an appointment tomorrow morning with his PCP. He understands return precautions and follow-up plan. Patient discharged in satisfactory condition.  Final Clinical Impressions(s) / ED Diagnoses   Final diagnoses:  Chest pain, unspecified type    New Prescriptions New Prescriptions   No medications on file     Laurence Spates, MD 11/15/16 1415

## 2016-11-15 NOTE — ED Triage Notes (Signed)
Patient from work via [facility] complaining of chest discomfort that started last night, that is relieved by stretching. Patient experienced same discomfort 2 weeks ago but it subsided on its own. Patient also complaining of dizziness, patient was not orthostatic with EMS. Patient received 2 nitro, and  of aspirin in route to facility. Patient was hypertensive with EMS initial encounter, patient 125/75 on arrival to ED. Patient has brain mass and frequent seizures, last one was 3 days ago.

## 2016-11-15 NOTE — ED Notes (Signed)
Attempted blood draw. Was unsuccessful. Phlebotomy made aware.

## 2016-11-20 ENCOUNTER — Emergency Department (HOSPITAL_COMMUNITY)
Admission: EM | Admit: 2016-11-20 | Discharge: 2016-11-20 | Disposition: A | Discharge status: Home / Self Care | Payer: Self-pay | Attending: Emergency Medicine | Admitting: Emergency Medicine

## 2016-11-20 ENCOUNTER — Encounter (HOSPITAL_COMMUNITY): Payer: Self-pay

## 2016-11-20 DIAGNOSIS — Z7982 Long term (current) use of aspirin: Secondary | ICD-10-CM | POA: Insufficient documentation

## 2016-11-20 DIAGNOSIS — Z79899 Other long term (current) drug therapy: Secondary | ICD-10-CM | POA: Insufficient documentation

## 2016-11-20 DIAGNOSIS — I10 Essential (primary) hypertension: Secondary | ICD-10-CM | POA: Insufficient documentation

## 2016-11-20 DIAGNOSIS — F17210 Nicotine dependence, cigarettes, uncomplicated: Secondary | ICD-10-CM | POA: Insufficient documentation

## 2016-11-20 MED ORDER — LIDOCAINE 5 % EX OINT: 1.0000 "application " | TOPICAL_OINTMENT | Freq: Four times a day (QID) | CUTANEOUS | 1 refills | Status: DC | PRN

## 2016-11-20 MED ORDER — KETOCONAZOLE 2 % EX CREA
1.0000 | TOPICAL_CREAM | Freq: Every day | CUTANEOUS | 0 refills | Status: DC
Start: 2016-11-20 — End: 2016-11-20

## 2016-11-20 MED ORDER — HYDROCODONE-ACETAMINOPHEN 5-325 MG PO TABS: 1.0000 | ORAL_TABLET | Freq: Four times a day (QID) | ORAL | 0 refills | Status: DC | PRN

## 2016-11-20 NOTE — ED Triage Notes (Signed)
Per Pt, Pt is coming for a note to return back to work. PCP is on vacation.

## 2016-11-20 NOTE — ED Provider Notes (Signed)
[facility] Provider Note    By signing my name below, I, Earmon Phoenix, attest that this documentation has been prepared under the direction and in the presence of Arthor Captain, PA-C. Electronically Signed: Earmon Phoenix, ED Scribe. 11/20/16. 11:31 AM.    History   Chief Complaint Chief Complaint  Patient presents with  . Work Note   The history is provided by the patient and medical records. No language interpreter was used.    Andrew Villarreal is a 56 y.o. male with PMHx of HTN, seizures, frontal lobe mass and migraines who presents to the Emergency Department wanting a note to return to work with no restrictions. He states he was put on restrictions due to HTN. He has been taking his prescribed BP medication as prescribed since receiving them four days ago. There are no modifying factors noted. He denies HA, nausea, vomiting, dizziness or light headedness. He states he does have a PCP but she is currently on vacation.   Past Medical History:  Diagnosis Date  . HTN (hypertension)   . Migraine   . Right frontal lobe mass    cyst  . Seizures (HCC)     There are no active problems to display for this patient.   Past Surgical History:  Procedure Laterality Date  . CARDIOVASCULAR STRESS TEST     normal     Home Medications    Prior to Admission medications   Medication Sig Start Date End Date Taking? Authorizing Provider  aspirin 81 MG tablet Take 81 mg by mouth daily.    Historical Provider, MD  HYDROcodone-acetaminophen (NORCO/VICODIN) 5-325 MG per tablet Take 1 tablet by mouth every 6 (six) hours as needed. 09/02/14   Roxy Horseman, PA-C  lisinopril (PRINIVIL,ZESTRIL) 5 MG tablet Take 5 mg by mouth daily.    Historical Provider, MD  naproxen (NAPROSYN) 375 MG tablet Take 375 mg by mouth 2 (two) times daily with a meal.    Historical Provider, MD    Family History Family History  Problem Relation Age of Onset  . Cancer Father   . Heart attack  Father   . Hypertension Sister     Social History Social History  Substance Use Topics  . Smoking status: Smoker, Current Status Unknown    Packs/day: 1.00    Types: Cigarettes  . Smokeless tobacco: Never Used  . Alcohol use No     Allergies   Clonazepam and Zofran [ondansetron hcl]   Review of Systems Review of Systems   Physical Exam Updated Vital Signs BP (!) 141/98 (BP Location: Left Arm)   Pulse 80   Temp 98 F (36.7 C) (Oral)   Resp 16   Ht  (1.778 m)   Wt 170 lb (77.1 kg)   SpO2 97%   BMI 24.39 kg/m   Physical Exam  Constitutional: He is oriented to person, place, and time. He appears well-developed and well-nourished.  HENT:  Head: Normocephalic and atraumatic.  Neck: Normal range of motion.  Cardiovascular: Normal rate.   Pulmonary/Chest: Effort normal.  Musculoskeletal: Normal range of motion.  Neurological: He is alert and oriented to person, place, and time.  Skin: Skin is warm and dry.  Psychiatric: He has a normal mood and affect. His behavior is normal.  Nursing note and vitals reviewed.    ED Treatments / Results  DIAGNOSTIC STUDIES: Oxygen Saturation is 97% on RA, normal by my interpretation.   COORDINATION OF CARE: 11:22 AM- Will provide work note. Pt verbalizes  understanding and agrees to plan.  Medications - No data to display  Labs (all labs ordered are listed, but only abnormal results are displayed) Labs Reviewed - No data to display  EKG  EKG Interpretation None       Radiology No results found.  Procedures Procedures (including critical care time)  Medications Ordered in ED Medications - No data to display   Initial Impression / Assessment and Plan / ED Course  I have reviewed the triage vital signs and the nursing notes.  Pertinent labs & imaging results that were available during my care of the patient were reviewed by me and considered in my medical decision making (see chart for details).      Patient in need of work note to return to work. Pt is hypertensive but not emergent. He is safe for discharge. Follow up with PCP.  Final Clinical Impressions(s) / ED Diagnoses   Final diagnoses:  Hypertension, unspecified type    New Prescriptions New Prescriptions   No medications on file    I personally performed the services described in this documentation, which was scribed in my presence. The recorded information has been reviewed and is accurate.       Arthor Captain, PA-C 11/20/16 [address] [place], [place] 11/21/16 [phone]

## 2016-11-20 NOTE — Discharge Instructions (Signed)
Contact a health care provider if:  You think you are having a reaction to a medicine you are taking.  You have headaches that keep coming back (recurring).  You feel dizzy.  You have swelling in your ankles.  You have trouble with your vision.  Get help right away if:  You develop a severe headache or confusion.  You have unusual weakness or numbness.  You feel faint.  You have severe pain in your chest or abdomen.  You vomit repeatedly.  You have trouble breathing.

## 2018-06-07 ENCOUNTER — Encounter (HOSPITAL_COMMUNITY): Payer: Self-pay

## 2018-06-07 ENCOUNTER — Emergency Department (HOSPITAL_BASED_OUTPATIENT_CLINIC_OR_DEPARTMENT_OTHER): Payer: Self-pay

## 2018-06-07 ENCOUNTER — Emergency Department (HOSPITAL_COMMUNITY)
Admission: EM | Admit: 2018-06-07 | Discharge: 2018-06-07 | Disposition: A | Discharge status: Home / Self Care | Payer: Self-pay | Attending: Emergency Medicine | Admitting: Emergency Medicine

## 2018-06-07 ENCOUNTER — Other Ambulatory Visit: Payer: Self-pay

## 2018-06-07 ENCOUNTER — Emergency Department (HOSPITAL_COMMUNITY): Payer: Self-pay

## 2018-06-07 DIAGNOSIS — Z7982 Long term (current) use of aspirin: Secondary | ICD-10-CM | POA: Insufficient documentation

## 2018-06-07 DIAGNOSIS — Y92009 Unspecified place in unspecified non-institutional (private) residence as the place of occurrence of the external cause: Secondary | ICD-10-CM | POA: Insufficient documentation

## 2018-06-07 DIAGNOSIS — X509XXA Other and unspecified overexertion or strenuous movements or postures, initial encounter: Secondary | ICD-10-CM | POA: Insufficient documentation

## 2018-06-07 DIAGNOSIS — Y999 Unspecified external cause status: Secondary | ICD-10-CM | POA: Insufficient documentation

## 2018-06-07 DIAGNOSIS — R600 Localized edema: Secondary | ICD-10-CM | POA: Insufficient documentation

## 2018-06-07 DIAGNOSIS — I10 Essential (primary) hypertension: Secondary | ICD-10-CM | POA: Insufficient documentation

## 2018-06-07 DIAGNOSIS — M79661 Pain in right lower leg: Secondary | ICD-10-CM | POA: Insufficient documentation

## 2018-06-07 DIAGNOSIS — F17210 Nicotine dependence, cigarettes, uncomplicated: Secondary | ICD-10-CM | POA: Insufficient documentation

## 2018-06-07 DIAGNOSIS — Y93K9 Activity, other involving animal care: Secondary | ICD-10-CM | POA: Insufficient documentation

## 2018-06-07 DIAGNOSIS — Z79899 Other long term (current) drug therapy: Secondary | ICD-10-CM | POA: Insufficient documentation

## 2018-06-07 DIAGNOSIS — R52 Pain, unspecified: Secondary | ICD-10-CM

## 2018-06-07 NOTE — Progress Notes (Signed)
Right lower extremity venous duplex has been completed. Negative for DVT. Results [facility] PA.  06/07/18 8:30 AM Olen CordialGreg Joselyne Spake RVT

## 2018-06-07 NOTE — Discharge Instructions (Addendum)
1. Medications: Alternate 600 mg of ibuprofen and [phone] mg of Tylenol every 3 hours as needed for pain. Do not exceed 4000 mg of Tylenol daily.  Take ibuprofen with food to avoid upset stomach issues.  2. Treatment: rest, ice, elevate and use ace wrap to apply compression, drink plenty of fluids, gentle stretching.  Use crutches to help you walk around; you can weight-bear as tolerated. 3. Follow Up: Please followup with orthopedics as directed or your PCP in 1 week if no improvement for discussion of your diagnoses and further evaluation after today's visit; if you do not have a primary care doctor use the resource guide provided to find one; Please return to the ER for worsening symptoms or other concerns such as worsening swelling, redness of the skin, fevers, loss of pulses, or loss of feeling  If your blood pressure (BP) was elevated on multiple readings during this visit above 130 for the top number or above 80 for the bottom number, please have this repeated by your primary care provider within one month. You can also check your blood pressure when you are out at a pharmacy or grocery store. Many have machines that will check your blood pressure.  If your blood pressure remains elevated, please follow-up with your PCP.

## 2018-06-07 NOTE — ED Provider Notes (Signed)
[facility] EMERGENCY DEPARTMENT Provider Note   CSN: [id number] Arrival date & time: 06/07/18  4098     History   Chief Complaint Chief Complaint  Patient presents with  . Leg Pain    HPI Andrew Villarreal is a 57 y.o. male history of hypertension, migraines, seizures presents for evaluation of acute onset, constant right calf occurring just 20 minutes prior to arrival.  Patient states that while he was chasing his dog at home he took a quick turn and planted his right foot on the ground.  He states he felt a "pop" in his right calf and since then has had constant sharp pain to the calf.  Pain radiates down the extremity when he attempts to bear weight and he has had difficulty ambulating and bearing weight on the extremity since then.  He denies any pain, ankle pain, or foot pain.  Denies numbness or tingling.  Has not tried any medicines for this prior to arrival.  No recent travel or surgeries, no prior history of DVT or PE.  He is not on hormonal placement therapy.  The history is provided by the patient.    Past Medical History:  Diagnosis Date  . HTN (hypertension)   . Migraine   . Right frontal lobe mass    cyst  . Seizures (HCC)     There are no active problems to display for this patient.   Past Surgical History:  Procedure Laterality Date  . CARDIOVASCULAR STRESS TEST     normal        Home Medications    Prior to Admission medications   Medication Sig Start Date End Date Taking? Authorizing Provider  aspirin 81 MG tablet Take 81 mg by mouth daily.    [provider]  lisinopril (PRINIVIL,ZESTRIL) 5 MG tablet Take 5 mg by mouth daily.    [provider]  naproxen (NAPROSYN) 375 MG tablet Take 375 mg by mouth 2 (two) times daily with a meal.    [provider]    Family History Family History  Problem Relation Age of Onset  . Cancer Father   . Heart attack Father   . Hypertension Sister     Social  History Social History   Tobacco Use  . Smoking status: Smoker, Current Status Unknown    Packs/day: 1.00    Types: Cigarettes  . Smokeless tobacco: Never Used  Substance Use Topics  . Alcohol use: No  . Drug use: No     Allergies   Clonazepam and Zofran [ondansetron hcl]   Review of Systems Review of Systems  Musculoskeletal: Positive for myalgias.  Neurological: Negative for weakness and numbness.     Physical Exam Updated Vital Signs BP (!) 175/101 (BP Location: Right Arm)   Pulse 66   Temp 97.9 F (36.6 C) (Oral)   Resp 14   Ht 5\' 10"  (1.778 m)   Wt 81.6 kg   SpO2 98%   BMI 25.83 kg/m   Physical Exam  Constitutional: He appears well-developed and well-nourished. No distress.  HENT:  Head: Normocephalic and atraumatic.  Eyes: Conjunctivae are normal. Right eye exhibits no discharge. Left eye exhibits no discharge.  Neck: No JVD present. No tracheal deviation present.  Cardiovascular: Normal rate and intact distal pulses.  2+ DP/PT pulses bilaterally, Homans sign present on the right, no lower extremity edema, but right calf slightly more swollen and slightly more firm to the touch as compared to the left.  Compartments are overall soft.  No erythema or warmth  Pulmonary/Chest: Effort normal.  Abdominal: He exhibits no distension.  Musculoskeletal: Normal range of motion. He exhibits tenderness. He exhibits no edema.       Right ankle: Achilles tendon normal. Achilles tendon exhibits no pain, no defect and normal Thompson's test results.  5/5 strength of BLE major muscle groups.  No ligamentous laxity or varus or valgus instability with regards to examination of the right knee and ankle.  Negative anterior/posterior drawer tests.  Mild right proximal fibular pain, however the majority of tenderness is elicited on palpation of the right calf.  Examination of the right Achilles tendon within normal limits.  Neurological: He is alert.  Fluent speech, no facial  droop, sensation intact to soft touch in bilateral lower extremities.  Ambulates with an antalgic gait and is unable to bear much weight on the right lower extremity.  Unable to Assess Heel Walk and Toe Walk secondary to pain.  Skin: Skin is warm and dry. No erythema.  Psychiatric: He has a normal mood and affect. His behavior is normal.  Nursing note and vitals reviewed.    ED Treatments / Results  Labs (all labs ordered are listed, but only abnormal results are displayed) Labs Reviewed - No data to display  EKG None  Radiology Dg Tibia/fibula Right  Result Date: 06/07/2018 CLINICAL DATA:  Heard pop.  Mid calf pain. EXAM: RIGHT TIBIA AND FIBULA - 2 VIEW COMPARISON:  None. FINDINGS: No acute bony abnormality. Specifically, no fracture, subluxation, or dislocation. Joint spaces maintained. No joint effusion within the right knee. Vascular calcifications present. IMPRESSION: No acute bony abnormality. Electronically Signed   By: Charlett NoseKevin  Dover M.D.   On: 06/07/2018 08:33    Procedures Procedures (including critical care time)  Medications Ordered in ED Medications - No data to display   Initial Impression / Assessment and Plan / ED Course  I have reviewed the triage vital signs and the nursing notes.  Pertinent labs & imaging results that were available during my care of the patient were reviewed by me and considered in my medical decision making (see chart for details).     Patient with acute onset of right calf pain secondary to injury this morning.  He is afebrile, hypertensive with improvement on reevaluation.  He does have a history of hypertension and to take his lisinopril this morning.  I did instruct him to follow-up with his PCP regarding his blood pressure if it remains elevated.  He is neurovascularly intact.  There is slightly more swelling and firmness to the right calf as compared to the left however compartments are generally soft.  Difficulty ambulating secondary to  pain but he exhibits good balance.  Radiographs show no acute osseous abnormality.  DVT study is negative for DVT.  SPECT possible ruptured Baker's cyst versus muscle strain.  Considered compartment syndrome however patient has intact distal pulses and sensation and no significant rigidity to the lower extremity on examination.  Recommend follow-up with PCP orthopedist for reevaluation of symptoms and evaluation of possible missed fracture diagnosis.  No evidence of septic joint.  Conservative therapy indicated and discussed with patient.  Discussed strict ED return precautions. Pt verbalized understanding of and agreement with plan and is safe for discharge home at this time.  No complaints prior to discharge.  Final Clinical Impressions(s) / ED Diagnoses   Final diagnoses:  Right calf pain    ED Discharge Orders    None  Jeanie Sewer, PA-C 06/07/18 1610    Tilden Fossa, MD 06/07/18 1021

## 2018-06-07 NOTE — ED Triage Notes (Signed)
Pt endorses chasing his dog at home, taking a quick turn, feeling a pop in his right calf and now has sharp constant right calf pain.

## 2018-06-07 NOTE — ED Notes (Signed)
ED Provider at bedside. 

## 2018-06-12 ENCOUNTER — Ambulatory Visit: Payer: Self-pay | Admitting: Internal Medicine

## 2018-06-12 ENCOUNTER — Encounter: Payer: Self-pay | Admitting: Internal Medicine

## 2018-06-12 VITALS — BP 150/90 | HR 62 | Resp 12 | Ht 70.0 in | Wt 196.0 lb

## 2018-06-12 DIAGNOSIS — Z23 Encounter for immunization: Secondary | ICD-10-CM

## 2018-06-12 DIAGNOSIS — R569 Unspecified convulsions: Secondary | ICD-10-CM | POA: Insufficient documentation

## 2018-06-12 DIAGNOSIS — K029 Dental caries, unspecified: Secondary | ICD-10-CM | POA: Insufficient documentation

## 2018-06-12 DIAGNOSIS — I10 Essential (primary) hypertension: Secondary | ICD-10-CM | POA: Insufficient documentation

## 2018-06-12 DIAGNOSIS — Z716 Tobacco abuse counseling: Secondary | ICD-10-CM

## 2018-06-12 MED ORDER — LISINOPRIL-HYDROCHLOROTHIAZIDE 10-12.5 MG PO TABS: 1.0000 | ORAL_TABLET | Freq: Every day | ORAL | 11 refills | Status: DC

## 2018-06-12 MED ORDER — HYDROCHLOROTHIAZIDE 25 MG PO TABS: ORAL_TABLET | ORAL | 0 refills | Status: DC

## 2018-06-12 NOTE — Progress Notes (Signed)
Subjective:    Patient ID: Andrew Villarreal, male    DOB: 02/07/1961, 57 y.o.   MRN: [id number]  HPI   Here to establish  1.  Hypertension:  Diagnosed in 2000.  Taking Lisinopril 10 mg daily.  States was also on HCTZ for a while as well. Has been off Lisinopril for 3 years.  States he really needed the HCTZ when last took meds regularly to keep in good range. Went to dentist a couple of weeks ago and they would not do anything due to bp.   Subsequently went to walk in clinic and put back on Lisinopril   BP was 170/100 about 2 weeks ago.    2.  Tobacco abuse:  38 pack year history. Tried to go cold [place] and with quit line.    3.  Right leg injury:  Was seen in ED and healing fine.  Chasing dog when he made a sharp turn and heard pop and pain in right calf.  Venous evaluation negative for DVT.  4.  History of Seizure Disorder:  Was followed at [facility], but could no longer afford 3 years ago.  Last seizure in 11/2015.   First received treatment in 2005. 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 States he was seizure free on med.  Cannot recall name. Had between 10-15 seizures he is aware of. Describes absence type seizure.  "locking up and standing and staring"  With no recollection or awareness of events at the time. Foods seemed to bring on: mustard or raw onions.   Would feel cold air on top of head as an aura. His dog barks before he has a seizure.  5.  Hyperlipidemia:  Last checked in this chart in 2010.  Current Meds  Medication Sig  . aspirin 81 MG tablet Take 81 mg by mouth daily.  Marland Kitchen. lisinopril (PRINIVIL,ZESTRIL) 10 MG tablet Take 10 mg by mouth daily.  . naproxen (NAPROSYN) 375 MG tablet Take 375 mg by mouth 2 (two) times daily with a meal.    Allergies  Allergen Reactions  . Clonazepam   . Zofran [Ondansetron Hcl] Hives    Broke out in hives immediately  after injection.     Past Medical History:  Diagnosis Date  . HTN (hypertension)   . Migraine   . Right frontal lobe mass    cyst  . Seizures (HCC)    No seizure since 2017.  Cyst on brain    Past Surgical History:  Procedure Laterality Date  . CARDIOVASCULAR STRESS TEST     normal    Family History  Problem Relation Age of Onset  . Heart attack Father   . Heart disease Father   . Cancer Father        metastatic to bone.  . Bipolar disorder Mother        Hospitalized frequently  . Hypertension Sister   . COPD Brother        history of smoking    Social History   Socioeconomic History  . Marital status: Married    Spouse name: Not on file  . Number of children: 3  . Years of education: Not on file  . Highest education level: Not on file  Occupational History  . Occupation: Runner, broadcasting/film/videoteacher    Comment: [employer]  Social Needs  . Financial resource strain:  Not on file  . Food insecurity:    Worry: Not on file    Inability: Not on file  . Transportation needs:    Medical: Not on file    Non-medical: Not on file  Tobacco Use  . Smoking status: Current Every Day Smoker    Packs/day: 1.00    Years: 38.00    Pack years: 38.00    Types: Cigarettes  . Smokeless tobacco: Never Used  Substance and Sexual Activity  . Alcohol use: No  . Drug use: No  . Sexual activity: Yes  Lifestyle  . Physical activity:    Days per week: Not on file    Minutes per session: Not on file  . Stress: Not on file  Relationships  . Social connections:    Talks on phone: Not on file    Gets together: Not on file    Attends religious service: Not on file    Active member of club or organization: Not on file    Attends meetings of clubs or organizations: Not on file    Relationship status: Not on file  . Intimate partner violence:    Fear of current or ex partner: Not on file    Emotionally abused: Not on file    Physically abused: Not on file    Forced sexual activity:  Not on file  Other Topics Concern  . Not on file  Social History Narrative   Active; swims and bikes    Review of Systems     Objective:   Physical Exam NAD HEENT:  PERRL, EOMI, TMs pearly gray, throat without injection.  Diffuse dental decay, receding gumline with dental roots visualized.   Neck:  Supple, No adenopathy Chest:  CTA CV:  RRR with normal S1 and S2, No S3, S4 or murmur.  Radial and DP pulses normal and equal. Neuro:  A & O x 3, CN  II-XII grossly intact.  Motor 5/5  Gait normal       Assessment & Plan:  1.  Hypertension:  Add HCTZ 12.5 mg daily to Lisinopril.  Then to fill Rx for Lisinopril/HCTZ combination pill thereafter.  2.  Right leg injury:  Healing nicely  3.  Dental decay:  Dental referral  4.  History of what sounds like Absence Seizures:  Sending for records regarding this from [place].  Discussed concern that he is driving, though states seizure free for 2 years.  He stopped meds due to inability to afford visits and meds.  Need to know what he was taking previously as well.  5.  Tobacco abuse:  Encouraged calling quit line and asking about patches. Discussed other medication options as well.  6.  HM:  Tdap  6.  Hyperlipidemia:  To return for fasting labs.

## 2018-06-18 ENCOUNTER — Other Ambulatory Visit: Payer: Self-pay

## 2018-06-18 VITALS — BP 130/82 | HR 78

## 2018-06-18 DIAGNOSIS — I10 Essential (primary) hypertension: Secondary | ICD-10-CM

## 2018-06-18 DIAGNOSIS — Z79899 Other long term (current) drug therapy: Secondary | ICD-10-CM

## 2018-06-18 DIAGNOSIS — Z13220 Encounter for screening for lipoid disorders: Secondary | ICD-10-CM

## 2018-06-18 NOTE — Progress Notes (Signed)
Patient BP now in normal range. Informed to continue current dose of medication. Patient verbalized understanding. 

## 2018-06-19 LAB — COMPREHENSIVE METABOLIC PANEL
A/G RATIO: 1.8 (ref 1.2–2.2)
ALT: 17 IU/L (ref 0–44)
AST: 15 IU/L (ref 0–40)
Albumin: 4.2 g/dL (ref 3.5–5.5)
Alkaline Phosphatase: 64 IU/L (ref 39–117)
BUN / CREAT RATIO: 10 (ref 9–20)
BUN: 9 mg/dL (ref 6–24)
Bilirubin Total: 0.3 mg/dL (ref 0.0–1.2)
CO2: 24 mmol/L (ref 20–29)
CREATININE: 0.9 mg/dL (ref 0.76–1.27)
Calcium: 9.5 mg/dL (ref 8.7–10.2)
Chloride: 103 mmol/L (ref 96–106)
GFR, EST AFRICAN AMERICAN: 110 mL/min/{1.73_m2} (ref >59)
GFR, EST NON AFRICAN AMERICAN: 95 mL/min/{1.73_m2} (ref >59)
GLUCOSE: 89 mg/dL (ref 65–99)
Globulin, Total: 2.3 g/dL (ref 1.5–4.5)
POTASSIUM: 5.2 mmol/L (ref 3.5–5.2)
SODIUM: 142 mmol/L (ref 134–144)
Total Protein: 6.5 g/dL (ref 6.0–8.5)

## 2018-06-19 LAB — LIPID PANEL W/O CHOL/HDL RATIO
CHOLESTEROL TOTAL: 207 mg/dL — AB (ref 100–199)
HDL: 38 mg/dL — ABNORMAL LOW (ref >39)
LDL CALC: 141 mg/dL — AB (ref 0–99)
Triglycerides: 142 mg/dL (ref 0–149)
VLDL Cholesterol Cal: 28 mg/dL (ref 5–40)

## 2018-06-19 LAB — CBC WITH DIFFERENTIAL/PLATELET
Basophils Absolute: 0.1 x10E3/uL (ref 0.0–0.2)
Basos: 1 %
EOS (ABSOLUTE): 0.2 x10E3/uL (ref 0.0–0.4)
Eos: 2 %
Hematocrit: 45.4 % (ref 37.5–51.0)
Hemoglobin: 15.8 g/dL (ref 13.0–17.7)
Immature Grans (Abs): 0 x10E3/uL (ref 0.0–0.1)
Immature Granulocytes: 0 %
Lymphocytes Absolute: 3.7 x10E3/uL — ABNORMAL HIGH (ref 0.7–3.1)
Lymphs: 42 %
MCH: 32.6 pg (ref 26.6–33.0)
MCHC: 34.8 g/dL (ref 31.5–35.7)
MCV: 94 fL (ref 79–97)
Monocytes Absolute: 0.5 x10E3/uL (ref 0.1–0.9)
Monocytes: 5 %
Neutrophils Absolute: 4.4 x10E3/uL (ref 1.4–7.0)
Neutrophils: 50 %
Platelets: 219 x10E3/uL (ref 150–450)
RBC: 4.84 x10E6/uL (ref 4.14–5.80)
RDW: 12.9 % (ref 12.3–15.4)
WBC: 8.8 x10E3/uL (ref 3.4–10.8)

## 2018-06-24 ENCOUNTER — Telehealth: Payer: Self-pay

## 2018-06-24 MED ORDER — LISINOPRIL 20 MG PO TABS: 20.0000 mg | ORAL_TABLET | Freq: Every day | ORAL | 11 refills | Status: DC

## 2018-06-24 NOTE — Telephone Encounter (Signed)
Patient called stating after starting his HCTZ he has been having really bad cramping. States he is cramping all over. Patient has not started taking the combination lisinopril/ HCTZ just yet. Patient wants to know if he should continue taking the medication or stop it all together.patient states the cramping started 3 days after starting HCTZ and he takes 25 mg 1/2 tablet daily.  To Dr. Delrae AlfredMulberry to advise.

## 2018-06-24 NOTE — Telephone Encounter (Signed)
Stop HCTZ Increase Lisinopril to 20 mg daily.  BMP and bp check in 2 weeks.

## 2018-06-25 NOTE — Telephone Encounter (Signed)
Spoke with patient. Informed to stop HCTZ and continue Lisinopril. Patient verbalized understanding. Patient will call back to schedule BP check with BMP

## 2018-07-24 DIAGNOSIS — J189 Pneumonia, unspecified organism: Secondary | ICD-10-CM

## 2018-07-24 HISTORY — DX: Pneumonia, unspecified organism: J18.9

## 2018-09-11 ENCOUNTER — Ambulatory Visit: Payer: Self-pay | Admitting: Internal Medicine

## 2018-10-11 ENCOUNTER — Ambulatory Visit: Payer: Self-pay | Admitting: Internal Medicine

## 2018-12-11 DIAGNOSIS — M549 Dorsalgia, unspecified: Secondary | ICD-10-CM | POA: Diagnosis not present

## 2018-12-11 DIAGNOSIS — R300 Dysuria: Secondary | ICD-10-CM | POA: Diagnosis not present

## 2018-12-19 DIAGNOSIS — M549 Dorsalgia, unspecified: Secondary | ICD-10-CM | POA: Diagnosis not present

## 2018-12-19 DIAGNOSIS — I10 Essential (primary) hypertension: Secondary | ICD-10-CM | POA: Diagnosis not present

## 2018-12-19 DIAGNOSIS — R300 Dysuria: Secondary | ICD-10-CM | POA: Diagnosis not present

## 2018-12-19 DIAGNOSIS — Z0389 Encounter for observation for other suspected diseases and conditions ruled out: Secondary | ICD-10-CM | POA: Diagnosis not present

## 2018-12-19 DIAGNOSIS — K760 Fatty (change of) liver, not elsewhere classified: Secondary | ICD-10-CM | POA: Diagnosis not present

## 2018-12-24 DIAGNOSIS — I10 Essential (primary) hypertension: Secondary | ICD-10-CM | POA: Diagnosis not present

## 2019-01-28 DIAGNOSIS — I10 Essential (primary) hypertension: Secondary | ICD-10-CM | POA: Diagnosis not present

## 2019-04-02 DIAGNOSIS — S91332A Puncture wound without foreign body, left foot, initial encounter: Secondary | ICD-10-CM | POA: Diagnosis not present

## 2019-04-08 DIAGNOSIS — M79672 Pain in left foot: Secondary | ICD-10-CM | POA: Diagnosis not present

## 2019-04-08 DIAGNOSIS — R600 Localized edema: Secondary | ICD-10-CM | POA: Diagnosis not present

## 2019-04-29 DIAGNOSIS — F17200 Nicotine dependence, unspecified, uncomplicated: Secondary | ICD-10-CM | POA: Diagnosis not present

## 2019-04-29 DIAGNOSIS — I10 Essential (primary) hypertension: Secondary | ICD-10-CM | POA: Diagnosis not present

## 2019-04-29 DIAGNOSIS — R7309 Other abnormal glucose: Secondary | ICD-10-CM | POA: Diagnosis not present

## 2019-04-29 DIAGNOSIS — T148XXA Other injury of unspecified body region, initial encounter: Secondary | ICD-10-CM | POA: Diagnosis not present

## 2019-05-27 DIAGNOSIS — R197 Diarrhea, unspecified: Secondary | ICD-10-CM | POA: Diagnosis not present

## 2019-05-27 DIAGNOSIS — I10 Essential (primary) hypertension: Secondary | ICD-10-CM | POA: Diagnosis not present

## 2019-09-02 ENCOUNTER — Ambulatory Visit: Payer: Self-pay | Attending: Internal Medicine

## 2019-09-02 DIAGNOSIS — Z20822 Contact with and (suspected) exposure to covid-19: Secondary | ICD-10-CM

## 2019-09-02 DIAGNOSIS — U071 COVID-19: Secondary | ICD-10-CM | POA: Insufficient documentation

## 2019-09-03 ENCOUNTER — Encounter: Payer: Self-pay | Admitting: Internal Medicine

## 2019-09-03 LAB — NOVEL CORONAVIRUS, NAA: SARS-CoV-2, NAA: DETECTED — AB

## 2019-09-04 ENCOUNTER — Telehealth: Payer: Self-pay | Admitting: Physician Assistant

## 2019-09-04 ENCOUNTER — Encounter: Payer: Self-pay | Admitting: Internal Medicine

## 2019-09-04 ENCOUNTER — Other Ambulatory Visit: Payer: Self-pay | Admitting: Physician Assistant

## 2019-09-04 DIAGNOSIS — U071 COVID-19: Secondary | ICD-10-CM

## 2019-09-04 DIAGNOSIS — I10 Essential (primary) hypertension: Secondary | ICD-10-CM

## 2019-09-04 NOTE — Telephone Encounter (Signed)
  I connected by phone with Andrew Villarreal on 09/04/2019 at 9:05 AM to discuss the potential use of an new treatment for mild to moderate COVID-19 viral infection in non-hospitalized patients.  This patient is a 59 y.o. male that meets the FDA criteria for Emergency Use Authorization of bamlanivimab or casirivimab\imdevimab.  Has a (+) direct SARS-CoV-2 viral test result  Has mild or moderate COVID-19   Is ? 58 years of age and weighs ? 40 kg  Is NOT hospitalized due to COVID-19  Is NOT requiring oxygen therapy or requiring an increase in baseline oxygen flow rate due to COVID-19  Is within 10 days of symptom onset  Has at least one of the high risk factor(s) for progression to severe COVID-19 and/or hospitalization as defined in EUA.  Specific high risk criteria : Hypertension   I have spoken and communicated the following to the patient or parent/caregiver:  1. FDA has authorized the emergency use of bamlanivimab and casirivimab\imdevimab for the treatment of mild to moderate COVID-19 in adults and pediatric patients with positive results of direct SARS-CoV-2 viral testing who are 25 years of age and older weighing at least 40 kg, and who are at high risk for progressing to severe COVID-19 and/or hospitalization.  2. The significant known and potential risks and benefits of bamlanivimab and casirivimab\imdevimab, and the extent to which such potential risks and benefits are unknown.  3. Information on available alternative treatments and the risks and benefits of those alternatives, including clinical trials.  4. Patients treated with bamlanivimab and casirivimab\imdevimab should continue to self-isolate and use infection control measures (e.g., wear mask, isolate, social distance, avoid sharing personal items, clean and disinfect "high touch" surfaces, and frequent handwashing) according to CDC guidelines.   5. The patient or parent/caregiver has the option to accept or refuse  bamlanivimab or casirivimab\imdevimab .  After reviewing this information with the patient, The patient agreed to proceed with receiving the bamlanimivab infusion and will be provided a copy of the Fact sheet prior to receiving the infusion.   Sx onset 09/01/19. Set up for infusion tomorrow 09/05/19 @ 8:30am. Directions given   Cline Crock PA-C 09/04/2019 9:05 AM

## 2019-09-05 ENCOUNTER — Encounter (HOSPITAL_COMMUNITY): Payer: Self-pay

## 2019-09-05 ENCOUNTER — Ambulatory Visit (HOSPITAL_COMMUNITY)
Admission: RE | Admit: 2019-09-05 | Discharge: 2019-09-05 | Disposition: A | Discharge status: Home / Self Care | Payer: HRSA Program | Source: Ambulatory Visit | Attending: Pulmonary Disease | Admitting: Pulmonary Disease

## 2019-09-05 DIAGNOSIS — Z23 Encounter for immunization: Secondary | ICD-10-CM | POA: Insufficient documentation

## 2019-09-05 DIAGNOSIS — I10 Essential (primary) hypertension: Secondary | ICD-10-CM | POA: Insufficient documentation

## 2019-09-05 DIAGNOSIS — U071 COVID-19: Secondary | ICD-10-CM | POA: Insufficient documentation

## 2019-09-05 MED ORDER — METHYLPREDNISOLONE SODIUM SUCC 125 MG IJ SOLR: 125.0000 mg | Freq: Once | INTRAMUSCULAR | Status: DC | PRN

## 2019-09-05 MED ORDER — DIPHENHYDRAMINE HCL 50 MG/ML IJ SOLN: 50.0000 mg | Freq: Once | INTRAMUSCULAR | Status: DC | PRN

## 2019-09-05 MED ORDER — EPINEPHRINE 0.3 MG/0.3ML IJ SOAJ: 0.3000 mg | Freq: Once | INTRAMUSCULAR | Status: DC | PRN

## 2019-09-05 MED ORDER — FAMOTIDINE IN NACL 20-0.9 MG/50ML-% IV SOLN: 20.0000 mg | Freq: Once | INTRAVENOUS | Status: DC | PRN

## 2019-09-05 MED ORDER — SODIUM CHLORIDE 0.9 % IV SOLN
700.0000 mg | Freq: Once | INTRAVENOUS | Status: DC
  Filled 2019-09-05: qty 20

## 2019-09-05 MED ORDER — SODIUM CHLORIDE 0.9 % IV SOLN
INTRAVENOUS | Status: DC | PRN
  Administered 2019-09-05: 250 mL via INTRAVENOUS

## 2019-09-05 MED ORDER — ALBUTEROL SULFATE HFA 108 (90 BASE) MCG/ACT IN AERS: 2.0000 | INHALATION_SPRAY | Freq: Once | RESPIRATORY_TRACT | Status: DC | PRN

## 2019-09-05 NOTE — Progress Notes (Signed)
Pt came in to Covid infusion clinic for Bamlaniviamb. Appt was originally scheduled for 12:30 but arrangement was made for him to come at 8:30.  When med was ready from pharmacy, pt declined infusion.  Pt stated he was upset  because he had to "wait for the medication, I don't feel well and I want to go home."   Explained to pt pharmacy had to mix the infusion medication, apologized for the inconvenience and this infusion could possibly help him feel better.   Pt refused and  chose to leave prior to getting infusion.  IV was removed and escorted to his car by RN.

## 2019-09-05 NOTE — Discharge Instructions (Signed)

## 2019-09-10 ENCOUNTER — Ambulatory Visit: Payer: Self-pay | Attending: Internal Medicine

## 2019-09-10 DIAGNOSIS — Z20822 Contact with and (suspected) exposure to covid-19: Secondary | ICD-10-CM | POA: Insufficient documentation

## 2019-09-12 ENCOUNTER — Other Ambulatory Visit: Payer: Self-pay

## 2019-09-12 LAB — NOVEL CORONAVIRUS, NAA: SARS-CoV-2, NAA: NOT DETECTED

## 2019-09-16 ENCOUNTER — Emergency Department (HOSPITAL_COMMUNITY): Payer: Self-pay

## 2019-09-16 ENCOUNTER — Other Ambulatory Visit: Payer: Self-pay

## 2019-09-16 ENCOUNTER — Encounter (HOSPITAL_COMMUNITY): Payer: Self-pay | Admitting: Emergency Medicine

## 2019-09-16 ENCOUNTER — Emergency Department (HOSPITAL_COMMUNITY)
Admission: EM | Admit: 2019-09-16 | Discharge: 2019-09-16 | Disposition: A | Discharge status: Home / Self Care | Payer: Self-pay | Attending: Emergency Medicine | Admitting: Emergency Medicine

## 2019-09-16 DIAGNOSIS — Z79899 Other long term (current) drug therapy: Secondary | ICD-10-CM | POA: Insufficient documentation

## 2019-09-16 DIAGNOSIS — F17210 Nicotine dependence, cigarettes, uncomplicated: Secondary | ICD-10-CM | POA: Insufficient documentation

## 2019-09-16 DIAGNOSIS — J189 Pneumonia, unspecified organism: Secondary | ICD-10-CM

## 2019-09-16 DIAGNOSIS — Z7982 Long term (current) use of aspirin: Secondary | ICD-10-CM | POA: Insufficient documentation

## 2019-09-16 DIAGNOSIS — I10 Essential (primary) hypertension: Secondary | ICD-10-CM | POA: Insufficient documentation

## 2019-09-16 DIAGNOSIS — J181 Lobar pneumonia, unspecified organism: Secondary | ICD-10-CM | POA: Insufficient documentation

## 2019-09-16 LAB — BASIC METABOLIC PANEL
Anion gap: 11 (ref 5–15)
BUN: 9 mg/dL (ref 6–20)
CO2: 22 mmol/L (ref 22–32)
Calcium: 9.4 mg/dL (ref 8.9–10.3)
Chloride: 105 mmol/L (ref 98–111)
Creatinine, Ser: 0.92 mg/dL (ref 0.61–1.24)
GFR calc Af Amer: >60 mL/min (ref >60)
GFR calc non Af Amer: >60 mL/min (ref >60)
Glucose, Bld: 95 mg/dL (ref 70–99)
Potassium: 3.8 mmol/L (ref 3.5–5.1)
Sodium: 138 mmol/L (ref 135–145)

## 2019-09-16 LAB — CBC
HCT: 46 % (ref 39.0–52.0)
Hemoglobin: 16 g/dL (ref 13.0–17.0)
MCH: 33.3 pg (ref 26.0–34.0)
MCHC: 34.8 g/dL (ref 30.0–36.0)
MCV: 95.6 fL (ref 80.0–100.0)
Platelets: 289 10*3/uL (ref 150–400)
RBC: 4.81 MIL/uL (ref 4.22–5.81)
RDW: 12.4 % (ref 11.5–15.5)
WBC: 10 10*3/uL (ref 4.0–10.5)
nRBC: 0 % (ref 0.0–0.2)

## 2019-09-16 LAB — TROPONIN I (HIGH SENSITIVITY)
Troponin I (High Sensitivity): <2 ng/L (ref <18)
Troponin I (High Sensitivity): 4 ng/L (ref <18)

## 2019-09-16 LAB — D-DIMER, QUANTITATIVE: D-Dimer, Quant: 0.52 ug/mL-FEU — ABNORMAL HIGH (ref 0.00–0.50)

## 2019-09-16 MED ORDER — IOHEXOL 350 MG/ML SOLN
100.0000 mL | Freq: Once | INTRAVENOUS | Status: AC | PRN
  Administered 2019-09-16: 14:00:00 100 mL via INTRAVENOUS

## 2019-09-16 MED ORDER — SODIUM CHLORIDE 0.9% FLUSH: 3.0000 mL | Freq: Once | INTRAVENOUS | Status: DC

## 2019-09-16 MED ORDER — AZITHROMYCIN 250 MG PO TABS: 250.0000 mg | ORAL_TABLET | Freq: Every day | ORAL | 0 refills | Status: DC

## 2019-09-16 NOTE — ED Notes (Signed)
Pt transported to xray 

## 2019-09-16 NOTE — ED Notes (Signed)
Lab to add on Ddimer 

## 2019-09-16 NOTE — ED Notes (Signed)
Pt transported to CT ?

## 2019-09-16 NOTE — ED Notes (Signed)
Repeat ekg done

## 2019-09-16 NOTE — ED Provider Notes (Signed)
[facility] Provider Note   CSN: [id number] Arrival date & time: 09/16/19  2979     History Chief Complaint  Patient presents with  . Chest Pain    covid positive    [facility] is a 59 y.o. male.  The history is provided by the patient.  Chest Pain Pain location:  Substernal area Pain quality: sharp and tightness   Pain radiates to:  Does not radiate Pain severity:  Moderate Onset quality:  Gradual Duration:  2 days Timing:  Constant Progression:  Waxing and waning Chronicity:  New Context comment:  Present all the time noticed it when he woke up a few days ago. Relieved by: Improved with ibuprofen. Worsened by:  Coughing, deep breathing, movement, certain positions and exertion Ineffective treatments:  None tried Associated symptoms: anorexia, cough, fatigue and shortness of breath   Associated symptoms: no abdominal pain, no back pain, no fever, no nausea and no palpitations   Associated symptoms comment:  Persistent loss of taste and smell.  Covid positive on 09/01/2018 and then tested negative on 09/09/2018 but states he is still very fatigued.  One episode of diarrhea yesterday.  With any activity it makes him feel very tired and slightly short of breath but does not feel short of breath at rest Risk factors: hypertension, male sex and smoking   Risk factors: no coronary artery disease, no diabetes mellitus, no immobilization, no prior DVT/PE and no surgery        Past Medical History:  Diagnosis Date  . HTN (hypertension)   . Migraine   . Right frontal lobe mass    cyst  . Seizures ([facility])    No seizure since 2017.  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 Dental decay 06/12/2018  . Seizures ([facility])   . HTN (hypertension)     Past Surgical History:  Procedure Laterality Date  . CARDIOVASCULAR STRESS TEST     normal       Family History  Problem Relation Age of Onset  . Heart  attack Father   . Heart disease Father   . Cancer Father        metastatic to bone.  . Bipolar disorder Mother        Hospitalized frequently  . Hypertension Sister   . COPD Brother        history of smoking    Social History   Tobacco Use  . Smoking status: Current Every Day Smoker    Packs/day: 1.00    Years: 38.00    Pack years: 38.00    Types: Cigarettes  . Smokeless tobacco: Never Used  Substance Use Topics  . Alcohol use: No  . Drug use: No    Home Medications Prior to Admission medications   Medication Sig Start Date End Date Taking? Authorizing Provider  amLODipine-benazepril (LOTREL) 10-40 MG capsule Take 1 capsule by mouth daily. 04/29/19  Yes [provider]  aspirin 81 MG tablet Take 81 mg by mouth daily.   Yes [provider]  ibuprofen (ADVIL) 100 MG tablet Take 200 mg by mouth every 6 (six) hours as needed for pain or fever.   Yes [provider]  lisinopril (PRINIVIL,ZESTRIL) 20 MG tablet Take 1 tablet (20 mg total) by mouth daily. Patient not taking: Reported on 09/16/2019 06/24/18   Julieanne Manson, MD    Allergies    Clonazepam and Zofran [facility] hcl]  Review of Systems   Review of Systems  Constitutional: Positive for fatigue. Negative for fever.  Respiratory: Positive for cough and shortness of breath.   Cardiovascular: Positive for chest pain. Negative for palpitations.  Gastrointestinal: Positive for anorexia. Negative for abdominal pain and nausea.  Musculoskeletal: Negative for back pain.  All other systems reviewed and are negative.   Physical Exam Updated Vital Signs BP (!) 144/93 (BP Location: Left Arm)   Pulse 85   Temp 98 F (36.7 C) (Oral)   Resp 16   SpO2 100%   Physical Exam Vitals and nursing note reviewed.  Constitutional:      General: He is not in acute distress.    Appearance: He is well-developed.  HENT:     Head: Normocephalic and atraumatic.  Eyes:     Conjunctiva/sclera:  Conjunctivae normal.     Pupils: Pupils are equal, round, and reactive to light.  Cardiovascular:     Rate and Rhythm: Normal rate and regular rhythm.     Heart sounds: No murmur.  Pulmonary:     Effort: Pulmonary effort is normal. No respiratory distress.     Breath sounds: Normal breath sounds. No wheezing or rales.  Chest:     Chest wall: Tenderness present.     Comments: Slight tenderness with palpation to the left side of the sternum Abdominal:     General: There is no distension.     Palpations: Abdomen is soft.     Tenderness: There is no abdominal tenderness. There is no guarding or rebound.  Musculoskeletal:        General: No tenderness. Normal range of motion.     Cervical back: Normal range of motion and neck supple.     Right lower leg: No tenderness.     Left lower leg: No tenderness.  Skin:    General: Skin is warm and dry.     Findings: No erythema or rash.  Neurological:     Mental Status: He is alert and oriented to person, place, and time.  Psychiatric:        Behavior: Behavior normal.     ED Results / Procedures / Treatments   Labs (all labs ordered are listed, but only abnormal results are displayed) Labs Reviewed  D-DIMER, QUANTITATIVE (NOT AT [facility]) - Abnormal; Notable for the following components:      Result Value   D-Dimer, Quant 0.52 (*)    All other components within normal limits  BASIC METABOLIC PANEL  CBC  TROPONIN I (HIGH SENSITIVITY)  TROPONIN I (HIGH SENSITIVITY)    EKG EKG Interpretation  Date/Time:  Tuesday September 16 2019 10:05:37 EST Ventricular Rate:  79 PR Interval:  200 QRS Duration: 82 QT Interval:  392 QTC Calculation: 449 R Axis:   3 Text Interpretation: Sinus rhythm with Fusion complexes Minimal voltage criteria for LVH, may be normal variant ( R in aVL ) Nonspecific ST and T wave abnormality Artifact Confirmed by Gwyneth Sprout ([id number]) on 09/16/2019 10:32:03 AM   Radiology DG Chest 2 View  Result Date:  09/16/2019 CLINICAL DATA:  COVID-19 positive. Chest pain and shortness of breath EXAM: CHEST - 2 VIEW COMPARISON:  November 15, 2016 FINDINGS: The lungs are clear. The heart size and pulmonary vascularity are normal. No adenopathy. No bone lesions. IMPRESSION: Lungs clear.  No evident adenopathy. Electronically Signed   By: Bretta Bang III M.D.   On: 09/16/2019 11:11   CT Angio Chest PE W and/or Wo Contrast  Result  Date: 09/16/2019 CLINICAL DATA:  Chest pain and cough.  Positive D-dimer study EXAM: CT ANGIOGRAPHY CHEST WITH CONTRAST TECHNIQUE: Multidetector CT imaging of the chest was performed using the standard protocol during bolus administration of intravenous contrast. Multiplanar CT image reconstructions and MIPs were obtained to evaluate the vascular anatomy. CONTRAST:  151mL OMNIPAQUE IOHEXOL 350 MG/ML SOLN COMPARISON:  February 10, 2010 CT angiogram chest FINDINGS: Cardiovascular: There is no demonstrable pulmonary embolus. There is no thoracic aortic aneurysm or dissection. There is atherosclerotic calcification at the origin of the left subclavian artery. There is no appreciable pericardial effusion or pericardial thickening. Mediastinum/Nodes: Thyroid appears normal. There is a lymph node to the left of the upper carina measuring 1.1 x 1.1 cm. There is a subcarinal lymph node measuring 1.5 x 1.4 cm. No esophageal lesions are evident. Lungs/Pleura: There is underlying centrilobular emphysematous change. There are scattered bullae in upper lobes bilaterally. There is atelectatic change in the posterior lung bases and inferior lingular region. There is focal airspace opacity in the superior segment of the right lower lobe. No pleural effusions are evident. Upper Abdomen: Visualized upper abdominal structures appear unremarkable. Musculoskeletal: No blastic or lytic bone lesions. No evident chest wall lesions. Review of the MIP images confirms the above findings. IMPRESSION: 1. No demonstrable pulmonary  embolus. No thoracic aortic aneurysm or dissection. 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 2. Underlying centrilobular emphysematous change with upper lobe scattered bullae. Bibasilar atelectasis. Focus of airspace opacity consistent with pneumonia in a portion of the superior segment right lower lobe. 3. Prominent pericarinal and subcarinal lymph nodes of uncertain etiology. Emphysema (ICD10-J43.9). Electronically Signed   By: Lowella Grip III M.D.   On: 09/16/2019 14:22    Procedures Procedures (including critical care time)  Medications Ordered in ED Medications  sodium chloride flush (NS) 0.9 % injection 3 mL (0 mLs Intravenous Hold 09/16/19 1052)    ED Course  I have reviewed the triage vital signs and the nursing notes.  Pertinent labs & imaging results that were available during my care of the patient were reviewed by me and considered in my medical decision making (see chart for details).    MDM Rules/Calculators/A&P                      59 year old male with a history of hypertension and known Covid positive on 09/01/2018 who has had 2 days of more pleuritic type chest pain.  It does improve with ibuprofen but due to it continuing he was concerned and wanted to get it checked out.  He has no persistent shortness of breath but when he exerts himself he feels slightly short of breath.  He is also still significantly fatigued in reports that he is not eating or drinking much because he has lost his taste and smell.  Patient is well-appearing on exam with reassuring vital signs and satting 100% on room air.  Lungs are clear and pain is slightly reproducible with palpation.  Concern for PE versus ongoing Covid pneumonia as a source for his symptoms.  Low suspicion for cardiac pathology such as pericarditis, myocarditis or ACS.  Initial EKG had artifact but repeat EKG shows no significant change since tracing in 2011.  Labs are pending.  Chest x-ray is clear without  significant cardiomegaly or acute lung pathology.  3:31 PM Labs are reassuring except for an elevated D-dimer.  CT showing no evidence of PE but does show a focal area of airspace  opacity in the right superior lung consistent with pneumonia.  Will treat patient with azithromycin he will continue NSAIDs or Tylenol as needed for pain.  Recommended follow-up in 1 week and given strict return precautions.  Final Clinical Impression(s) / ED Diagnoses Final diagnoses:  Community acquired pneumonia of right upper lobe of lung    Rx / DC Orders ED Discharge Orders         Ordered    azithromycin (ZITHROMAX) 250 MG tablet  Daily     09/16/19 1518           Gwyneth Sprout, MD 09/16/19 [phone]

## 2019-09-16 NOTE — ED Triage Notes (Signed)
Pt reports he had a positive covid test on 2/9 and then had a neg test on 2/17. States that he now has central to L chest pain x2 days, endorses continued dry cough but denies any other symptoms.

## 2019-09-22 ENCOUNTER — Telehealth: Payer: Self-pay | Admitting: Internal Medicine

## 2019-09-22 NOTE — Telephone Encounter (Signed)
Patient called stating was positive with COVID and got re-tested a week ago with negative results. Patient stating still feeling chest pain with cough and phlegm is coming out.  Patient states still feeling not good at all. Patient asking for advise.

## 2019-09-23 NOTE — Telephone Encounter (Signed)
Left message for patient to call back with how he is feeling

## 2019-09-24 NOTE — Telephone Encounter (Signed)
Spoke with Dr. Delrae Alfred regarding patient. States he needs appointment. Patient scheduled for tomorrow at 10:30 am. Patient has pneumonia and not feeling any better. Patient had tested positive for COVID in Feb but had a negative result on 09/10/19.

## 2019-09-25 ENCOUNTER — Ambulatory Visit: Payer: Self-pay | Admitting: Internal Medicine

## 2019-09-25 ENCOUNTER — Encounter: Payer: Self-pay | Admitting: Internal Medicine

## 2019-09-25 ENCOUNTER — Other Ambulatory Visit: Payer: Self-pay

## 2019-09-25 VITALS — BP 102/70 | HR 70 | Resp 12 | Ht 70.0 in | Wt 188.0 lb

## 2019-09-25 DIAGNOSIS — J449 Chronic obstructive pulmonary disease, unspecified: Secondary | ICD-10-CM

## 2019-09-25 DIAGNOSIS — U071 COVID-19: Secondary | ICD-10-CM

## 2019-09-25 DIAGNOSIS — J181 Lobar pneumonia, unspecified organism: Secondary | ICD-10-CM

## 2019-09-25 MED ORDER — ALBUTEROL SULFATE HFA 108 (90 BASE) MCG/ACT IN AERS: 2.0000 | INHALATION_SPRAY | RESPIRATORY_TRACT | 1 refills | Status: DC | PRN

## 2019-09-25 NOTE — Progress Notes (Signed)
    Subjective:    Patient ID: [facility], male   DOB: 1961-03-04, 59 y.o.   MRN: [id number]   HPI   Became ill with COVID19 on Feb 8.  Diagnosed on Feb 9th.  He was at the [facility] outpatient clinic for infusion of bamlanivumab, but became so ill that he left to go home and lie down.  Seemed to improve by Feb 18th.   The evening of the 18th, though his chest started hurting --center of chest like a throbbing pain. Became more short of breath.  No fever.   On February 23rd, due to increasing chest pain and less so, dyspnea, he went into ED where CT angio of chest negated a PE, but showed RLL pneumonia and extensive emphysema.   Treated with Azithromycin 500 mg that day and then 250 mg daily for 4 more days. Finished his antibiotics this past Saturday, the 27th.   Still with pain in his chest.  No dyspnea, but deeper sharp pain with deep breathing.   Has been sleeping propped up.  More pain when lies on right.   Not sleeping well. Taste and smell have returned. Still very fatigued.   He has not had a fever since beginning of his original illness with covid.  Tobacco abuse:  Has not smoked recently as it hurts too much to inhale--later admits to 10 cigs daily.   Was smoking a ppd prior to illness. Smoked last this morning.    Current Meds  Medication Sig  . amLODipine-benazepril (LOTREL) 10-40 MG capsule Take 1 capsule by mouth daily.  Marland Kitchen ibuprofen (ADVIL) 100 MG tablet Take 200 mg by mouth every 6 (six) hours as needed for pain or fever.   Allergies  Allergen Reactions  . Clonazepam   . Zofran [Ondansetron Hcl] Hives    Broke out in hives immediately after injection.      Review of Systems    Objective:   BP 102/70 (BP Location: Left Arm, Patient Position: Sitting, Cuff Size: Normal)   Pulse 70   Resp 12   Ht 5\' 10"  (1.778 m)   Wt 188 lb (85.3 kg)   SpO2 98%   PF 330 L/min   BMI 26.98 kg/m   Physical Exam  NAD HEENT:  PERRL, EOMI, TMs pearly gray, throat without  injection.  Nasal mucosa normal Neck:  Supple, No adenopathy Lungs:  CTA, but with junky cough CV:  RRR without murmur or rub.  Radial and DP pulses normal and equal. Abd:  S, NT, No HSM or mass, + BS LE:  No edema.   Assessment & Plan  1.  COPD with recent pneumonia/COVID19:  With productive sounding cough and findings on CT of chest, appears to have mixed chronic bronchitis and emphysema at this point.   CBC Advair 100/50 1 inhalation twice daily and brush teeth and tongue afterward. Albuterol  MDI 2 puffs every 4-6 hours as needed. Incruse Ellipta inhaled once daily  Call left with [facility] to see if can get him on samples now when he comes in to start application process for inhalers.    2.  Tobacco cessation counseling.  Discussed obtaining nicotine patches.  Quitline information given.  Not clear he is motivated to stop.  Discussed he already has substantial lung damage on CT and will only worsen if he continues to smoke.

## 2019-09-25 NOTE — Patient Instructions (Signed)
Tobacco Cessation:   [id number] or [phone], the former for support and possibly free nicotine patches/gum and support; the latter for [facility] Smoking cessation class. Get rid of all smoking supplies:  Cigarettes, lighters, ashtrays--no stashes just in case at home if you are serious.For nicotine patches:  Stop smoking anything the day you start the first patch Start with 21 mg patch and reapply new to different area of skin every 24 hours for 28 days. Then 14 mg patch changed every 24 hours for 14 days. Then 7 mg patch changed every 24 hours for 14 days.  For Nicotine gum:  When you feel need for smoking:  Place nicotine gum in mouth and chew until juicy, then stick between gum and cheek.  You will absorb the nicotine from your mouth.   Do not aggressively chew gum. Spit gum out in garbage once you feel you are done with it.

## 2019-09-26 LAB — CBC WITH DIFFERENTIAL/PLATELET
Basophils Absolute: 0.1 10*3/uL (ref 0.0–0.2)
Basos: 1 %
EOS (ABSOLUTE): 0.2 10*3/uL (ref 0.0–0.4)
Eos: 2 %
Hematocrit: 48.8 % (ref 37.5–51.0)
Hemoglobin: 17 g/dL (ref 13.0–17.7)
Immature Grans (Abs): 0 10*3/uL (ref 0.0–0.1)
Immature Granulocytes: 0 %
Lymphocytes Absolute: 3.8 10*3/uL — ABNORMAL HIGH (ref 0.7–3.1)
Lymphs: 38 %
MCH: 32.8 pg (ref 26.6–33.0)
MCHC: 34.8 g/dL (ref 31.5–35.7)
MCV: 94 fL (ref 79–97)
Monocytes Absolute: 0.7 10*3/uL (ref 0.1–0.9)
Monocytes: 7 %
Neutrophils Absolute: 5.1 10*3/uL (ref 1.4–7.0)
Neutrophils: 52 %
Platelets: 278 10*3/uL (ref 150–450)
RBC: 5.19 x10E6/uL (ref 4.14–5.80)
RDW: 12.9 % (ref 11.6–15.4)
WBC: 9.9 10*3/uL (ref 3.4–10.8)

## 2019-09-26 MED ORDER — FLUTICASONE-SALMETEROL 100-50 MCG/DOSE IN AEPB: INHALATION_SPRAY | RESPIRATORY_TRACT | 11 refills | Status: AC

## 2019-09-26 MED ORDER — INCRUSE ELLIPTA 62.5 MCG/INH IN AEPB: 1.0000 | INHALATION_SPRAY | Freq: Every day | RESPIRATORY_TRACT | 11 refills | Status: AC

## 2019-09-26 MED ORDER — ALBUTEROL SULFATE HFA 108 (90 BASE) MCG/ACT IN AERS: 2.0000 | INHALATION_SPRAY | Freq: Four times a day (QID) | RESPIRATORY_TRACT | 6 refills | Status: AC | PRN

## 2019-10-02 ENCOUNTER — Ambulatory Visit: Payer: Self-pay | Admitting: Internal Medicine

## 2019-10-03 ENCOUNTER — Ambulatory Visit: Payer: Self-pay | Admitting: Internal Medicine

## 2019-10-07 ENCOUNTER — Ambulatory Visit: Payer: Self-pay | Admitting: Internal Medicine

## 2019-12-05 ENCOUNTER — Encounter (HOSPITAL_COMMUNITY): Payer: Self-pay

## 2019-12-05 ENCOUNTER — Ambulatory Visit (INDEPENDENT_AMBULATORY_CARE_PROVIDER_SITE_OTHER): Payer: Self-pay

## 2019-12-05 ENCOUNTER — Ambulatory Visit (HOSPITAL_COMMUNITY)
Admission: EM | Admit: 2019-12-05 | Discharge: 2019-12-05 | Disposition: A | Discharge status: Home / Self Care | Payer: Self-pay | Attending: Urgent Care | Admitting: Urgent Care

## 2019-12-05 ENCOUNTER — Other Ambulatory Visit: Payer: Self-pay

## 2019-12-05 DIAGNOSIS — M898X1 Other specified disorders of bone, shoulder: Secondary | ICD-10-CM

## 2019-12-05 DIAGNOSIS — M5412 Radiculopathy, cervical region: Secondary | ICD-10-CM

## 2019-12-05 DIAGNOSIS — M546 Pain in thoracic spine: Secondary | ICD-10-CM

## 2019-12-05 MED ORDER — PREDNISONE 20 MG PO TABS: ORAL_TABLET | ORAL | 0 refills | Status: DC

## 2019-12-05 MED ORDER — TIZANIDINE HCL 4 MG PO TABS: 4.0000 mg | ORAL_TABLET | Freq: Three times a day (TID) | ORAL | 0 refills | Status: DC | PRN

## 2019-12-05 NOTE — ED Triage Notes (Signed)
Pt c/o acute onset thoracic back pain under right shoulder pain approx 5 days ago. Pt states pain is worse moving, pain improves with lying down/sitting straight up. Denies any known injury/trauma to area. Denies CP, SOB. States ibuprofen daily has not improved sx.

## 2019-12-05 NOTE — ED Provider Notes (Signed)
[place]   MRN: [id number] DOB: [birth year]  Subjective:   [facility] is a 59 y.o. male presenting for 5-day history of acute onset persistent right scapular pain.  Patient states that he woke up with the symptoms.  Has been taking 400 mg ibuprofen every 4 hours with minimal relief.  Denies trauma, falls, heavy lifting, strenuous work activities or strenuous exercise.  Hydrates with 2-3 bottles of water per day.  Works as a Education officer, [employer].  No current facility-administered medications for this encounter.  Current Outpatient Medications:  .  albuterol (VENTOLIN HFA) 108 (90 Base) MCG/ACT inhaler, Inhale 2 puffs into the lungs every 6 (six) hours as needed for wheezing or shortness of breath., Disp: 18 g, Rfl: 6 .  amLODipine-benazepril (LOTREL) 10-40 MG capsule, Take 1 capsule by mouth daily., Disp: , Rfl:  .  Fluticasone-Salmeterol (ADVAIR DISKUS) 100-50 MCG/DOSE AEPB, 1 inhalation twice daily then brush teeth and tongue, Disp: 60 each, Rfl: 11 .  umeclidinium bromide (INCRUSE ELLIPTA) 62.5 MCG/INH AEPB, Inhale 1 puff into the lungs daily., Disp: 30 each, Rfl: 11   Allergies  Allergen Reactions  . Clonazepam   . Zofran [Ondansetron Hcl] Hives    Broke out in hives immediately after injection.     Past Medical History:  Diagnosis Date  . HTN (hypertension)   . Migraine   . Right frontal lobe mass    cyst  . Seizures ([place])    No seizure since 2017.  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 CARDIOVASCULAR STRESS TEST     normal    Family History  Problem Relation Age of Onset  . Heart attack Father   . Heart disease Father   . Cancer Father        metastatic to bone.  . Bipolar disorder Mother        Hospitalized frequently  . Hypertension Sister   . COPD Brother        history of smoking    Social History   Tobacco Use  . Smoking status: Current Every Day Smoker    Packs/day: 1.00    Years: 38.00    Pack years:  38.00    Types: Cigarettes  . Smokeless tobacco: Never Used  Substance Use Topics  . Alcohol use: No  . Drug use: No    ROS   Objective:   Vitals: BP 124/78 (BP Location: Right Arm)   Pulse 64   Temp 97.7 F (36.5 C) (Oral)   Resp 16   SpO2 97%   Physical Exam Constitutional:      General: He is not in acute distress.    Appearance: Normal appearance. He is well-developed and normal weight. He is not ill-appearing, toxic-appearing or diaphoretic.  HENT:     Head: Normocephalic and atraumatic.     Right Ear: External ear normal.     Left Ear: External ear normal.     Nose: Nose normal.     Mouth/Throat:     Pharynx: Oropharynx is clear.  Eyes:     General: No scleral icterus.       Right eye: No discharge.        Left eye: No discharge.     Extraocular Movements: Extraocular movements intact.     Pupils: Pupils are equal, round, and reactive to light.  Cardiovascular:     Rate and Rhythm: Normal rate.  Pulmonary:     Effort: Pulmonary  effort is normal.  Musculoskeletal:     Cervical back: Normal range of motion.       Back:  Neurological:     Mental Status: He is alert and oriented to person, place, and time.  Psychiatric:        Mood and Affect: Mood normal.        Behavior: Behavior normal.        Thought Content: Thought content normal.        Judgment: Judgment normal.     DG Scapula Right  Result Date: 12/05/2019 CLINICAL DATA:  Right scapula pain for 5 days EXAM: RIGHT SCAPULA - 2+ VIEWS COMPARISON:  None. FINDINGS: There is no evidence of fracture or other focal bone lesions. Soft tissues are unremarkable. IMPRESSION: Negative. Electronically Signed   By: Marnee Spring M.D.   On: 12/05/2019 08:56    Assessment and Plan :   PDMP not reviewed this encounter.  1. Pain of right scapula   2. Acute right-sided thoracic back pain   3. Cervical radiculopathy     Upon return from x-ray patient stated that the positioning actually caused pain from  his right trapezius to shoot down his right arm.  Will use prednisone course given treatment failure with Advil.  Use muscle relaxant.  Discussed need for further imaging with PCP, has a follow-up set up in 2 weeks. Counseled patient on potential for adverse effects with medications prescribed/recommended today, ER and return-to-clinic precautions discussed, patient verbalized understanding.    Wallis Bamberg, [place] 12/05/19 [phone]

## 2019-12-07 ENCOUNTER — Emergency Department (HOSPITAL_COMMUNITY)
Admission: EM | Admit: 2019-12-07 | Discharge: 2019-12-07 | Disposition: A | Discharge status: Home / Self Care | Payer: Self-pay | Attending: Emergency Medicine | Admitting: Emergency Medicine

## 2019-12-07 ENCOUNTER — Encounter (HOSPITAL_COMMUNITY): Payer: Self-pay | Admitting: *Deleted

## 2019-12-07 ENCOUNTER — Other Ambulatory Visit: Payer: Self-pay

## 2019-12-07 DIAGNOSIS — Z5321 Procedure and treatment not carried out due to patient leaving prior to being seen by health care provider: Secondary | ICD-10-CM | POA: Insufficient documentation

## 2019-12-07 DIAGNOSIS — M25511 Pain in right shoulder: Secondary | ICD-10-CM | POA: Insufficient documentation

## 2019-12-07 NOTE — ED Notes (Signed)
Patient states there is no way he can sit up any longer and states he will come back another time. Advised patient to stay

## 2019-12-07 NOTE — ED Triage Notes (Signed)
The pt is c/o rt shoulder  On Wednesday  He is c/o numbness of the fingers on his rt hand  [facility]  He reports noknown injury

## 2019-12-08 ENCOUNTER — Ambulatory Visit (HOSPITAL_COMMUNITY)
Admission: EM | Admit: 2019-12-08 | Discharge: 2019-12-08 | Disposition: A | Discharge status: Home / Self Care | Payer: Self-pay | Attending: Internal Medicine | Admitting: Internal Medicine

## 2019-12-08 ENCOUNTER — Encounter (HOSPITAL_COMMUNITY): Payer: Self-pay

## 2019-12-08 ENCOUNTER — Other Ambulatory Visit: Payer: Self-pay

## 2019-12-08 DIAGNOSIS — M5010 Cervical disc disorder with radiculopathy, unspecified cervical region: Secondary | ICD-10-CM

## 2019-12-08 MED ORDER — TRAMADOL HCL 50 MG PO TABS: 50.0000 mg | ORAL_TABLET | Freq: Two times a day (BID) | ORAL | 0 refills | Status: DC | PRN

## 2019-12-08 MED ORDER — KETOROLAC TROMETHAMINE 30 MG/ML IJ SOLN
30.0000 mg | Freq: Once | INTRAMUSCULAR | Status: AC
  Administered 2019-12-08: 30 mg via INTRAMUSCULAR

## 2019-12-08 MED ORDER — KETOROLAC TROMETHAMINE 30 MG/ML IJ SOLN
INTRAMUSCULAR | Status: AC
  Filled 2019-12-08: qty 1

## 2019-12-08 NOTE — ED Triage Notes (Addendum)
Pt is here with right arm numbness that started 12/05/2019(original visit), during the X-Ray he was given here. Pt has taken Prednisone to relieve discomfort.

## 2019-12-08 NOTE — ED Provider Notes (Signed)
[place]    CSN: [id number] Arrival date & time: 12/08/19  1544      History   Chief Complaint Chief Complaint  Patient presents with  . arm numbess    HPI Andrew Villarreal is a 59 y.o. male comes to the urgent care with right upper extremity pain.  Patient was seen here about a week ago and started on a tapering course of prednisone.  Patient did not get any relief from prednisone.  X-ray of the right scapula was negative for any fracture at that time.  Patient describes the pain as severe involving the neck.  Pain is sharp and shoots down his right arm.  Patient denies any trauma to the neck or falls.  No fever or chills.  He has associated numbness in his hand.  No weakness in the upper extremities.  No weakness in the lower extremities.   HPI  Past Medical History:  Diagnosis Date  . HTN (hypertension)   . Migraine   . Right frontal lobe mass    cyst  . Seizures ([place])    No seizure since 2017.  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 Dental decay 06/12/2018  . Seizures ([place])   . HTN (hypertension)     Past Surgical History:  Procedure Laterality Date  . CARDIOVASCULAR STRESS TEST     normal       Home Medications    Prior to Admission medications   Medication Sig Start Date End Date Taking? Authorizing Provider  albuterol (VENTOLIN HFA) 108 (90 Base) MCG/ACT inhaler Inhale 2 puffs into the lungs every 6 (six) hours as needed for wheezing or shortness of breath. 09/26/19   Mack Hook, MD  amLODipine-benazepril (LOTREL) 10-40 MG capsule Take 1 capsule by mouth daily. 04/29/19   [provider]  Fluticasone-Salmeterol (ADVAIR DISKUS) 100-50 MCG/DOSE AEPB 1 inhalation twice daily then brush teeth and tongue 09/26/19   Mack Hook, MD  predniSONE (DELTASONE) 20 MG tablet Day 1-5: Take 2 tablets daily. Day 6-10: Take 1 tablet daily. Take tablets with breakfast. 12/05/19   Jaynee Eagles, PA-C    tiZANidine (ZANAFLEX) 4 MG tablet Take 1 tablet (4 mg total) by mouth every 8 (eight) hours as needed. 12/05/19   Jaynee Eagles, PA-C  traMADol (ULTRAM) 50 MG tablet Take 1 tablet (50 mg total) by mouth every 12 (twelve) hours as needed. 12/08/19   Aladdin Kollmann, Myrene Galas, MD  umeclidinium bromide (INCRUSE ELLIPTA) 62.5 MCG/INH AEPB Inhale 1 puff into the lungs daily. 09/26/19   Mack Hook, MD  lisinopril (PRINIVIL,ZESTRIL) 20 MG tablet Take 1 tablet (20 mg total) by mouth daily. Patient not taking: Reported on 09/16/2019 06/24/18 12/05/19  Mack Hook, MD    Family History Family History  Problem Relation Age of Onset  . Heart attack Father   . Heart disease Father   . Cancer Father        metastatic to bone.  . Bipolar disorder Mother        Hospitalized frequently  . Hypertension Sister   . COPD Brother        history of smoking    Social History Social History   Tobacco Use  . Smoking status: Current Every Day Smoker    Packs/day: 1.00    Years: 38.00    Pack years: 38.00    Types: Cigarettes  . Smokeless tobacco: Never Used  Substance Use Topics  . Alcohol  use: Not Currently  . Drug use: No     Allergies   Clonazepam and Zofran [ondansetron hcl]   Review of Systems Review of Systems  Constitutional: Negative for activity change, chills, fatigue and fever.  Gastrointestinal: Negative.   Musculoskeletal: Positive for back pain, neck pain and neck stiffness. Negative for arthralgias.  Skin: Negative.   Neurological: Positive for weakness and numbness. Negative for dizziness, light-headedness and headaches.     Physical Exam Triage Vital Signs ED Triage Vitals  Enc Vitals Group     BP 12/08/19 1606 128/74     Pulse Rate 12/08/19 1606 72     Resp 12/08/19 1606 18     Temp 12/08/19 1606 98.1 F (36.7 C)     Temp Source 12/08/19 1606 Oral     SpO2 12/08/19 1606 95 %     Weight 12/08/19 1601 188 lb 0.8 oz (85.3 kg)     Height --      Head Circumference  --      Peak Flow --      Pain Score 12/08/19 1601 0     Pain Loc --      Pain Edu? --      Excl. in GC? --    No data found.  Updated Vital Signs BP 128/74 (BP Location: Right Arm)   Pulse 72   Temp 98.1 F (36.7 C) (Oral)   Resp 18   Wt 85.3 kg   SpO2 95%   BMI 26.98 kg/m   Visual Acuity Right Eye Distance:   Left Eye Distance:   Bilateral Distance:    Right Eye Near:   Left Eye Near:    Bilateral Near:     Physical Exam Vitals and nursing note reviewed.  Constitutional:      General: He is not in acute distress.    Appearance: He is not ill-appearing.  Cardiovascular:     Rate and Rhythm: Normal rate and regular rhythm.     Pulses: Normal pulses.     Heart sounds: Normal heart sounds.  Pulmonary:     Effort: Pulmonary effort is normal.     Breath sounds: Normal breath sounds.  Abdominal:     General: Bowel sounds are normal.     Palpations: Abdomen is soft.  Musculoskeletal:        General: No swelling or signs of injury. Normal range of motion.     Right lower leg: No edema.  Skin:    Capillary Refill: Capillary refill takes less than 2 seconds.  Neurological:     General: No focal deficit present.     Mental Status: He is alert.      UC Treatments / Results  Labs (all labs ordered are listed, but only abnormal results are displayed) Labs Reviewed - No data to display  EKG   Radiology No results found.  Procedures Procedures (including critical care time)  Medications Ordered in UC Medications  ketorolac (TORADOL) 30 MG/ML injection 30 mg (30 mg Intramuscular Given 12/08/19 1721)    Initial Impression / Assessment and Plan / UC Course  I have reviewed the triage vital signs and the nursing notes.  Pertinent labs & imaging results that were available during my care of the patient were reviewed by me and considered in my medical decision making (see chart for details).     1.  Cervical disc disorder with cervical  radiculopathy: Toradol 30 mg IM x1 dose Continue prednisone Patient is advised to go  to the [facility] urgent care to be evaluated. Tramadol 50 mg every 6 hours as needed for pain Return precautions given Patient verbalizes understanding  Final Clinical Impressions(s) / UC Diagnoses   Final diagnoses:  Cervical disc disorder with radiculopathy of cervical region   Discharge Instructions   None    ED Prescriptions    Medication Sig Dispense Auth. Provider   traMADol (ULTRAM) 50 MG tablet Take 1 tablet (50 mg total) by mouth every 12 (twelve) hours as needed. 10 tablet Julann Mcgilvray, Britta Mccreedy, MD     I have reviewed the PDMP during this encounter.   Merrilee Jansky, MD 12/08/19 [phone]

## 2019-12-19 ENCOUNTER — Other Ambulatory Visit: Payer: Self-pay

## 2019-12-19 ENCOUNTER — Emergency Department (HOSPITAL_COMMUNITY)
Admission: EM | Admit: 2019-12-19 | Discharge: 2019-12-19 | Disposition: A | Discharge status: Home / Self Care | Payer: Self-pay | Attending: Emergency Medicine | Admitting: Emergency Medicine

## 2019-12-19 ENCOUNTER — Encounter (HOSPITAL_COMMUNITY): Payer: Self-pay | Admitting: Emergency Medicine

## 2019-12-19 ENCOUNTER — Emergency Department (HOSPITAL_COMMUNITY): Payer: Self-pay

## 2019-12-19 DIAGNOSIS — F17210 Nicotine dependence, cigarettes, uncomplicated: Secondary | ICD-10-CM | POA: Insufficient documentation

## 2019-12-19 DIAGNOSIS — M4802 Spinal stenosis, cervical region: Secondary | ICD-10-CM | POA: Insufficient documentation

## 2019-12-19 DIAGNOSIS — M5030 Other cervical disc degeneration, unspecified cervical region: Secondary | ICD-10-CM | POA: Insufficient documentation

## 2019-12-19 DIAGNOSIS — M4310 Spondylolisthesis, site unspecified: Secondary | ICD-10-CM | POA: Insufficient documentation

## 2019-12-19 DIAGNOSIS — Z79899 Other long term (current) drug therapy: Secondary | ICD-10-CM | POA: Insufficient documentation

## 2019-12-19 DIAGNOSIS — I10 Essential (primary) hypertension: Secondary | ICD-10-CM | POA: Insufficient documentation

## 2019-12-19 MED ORDER — HYDROCODONE-ACETAMINOPHEN 5-325 MG PO TABS: 1.0000 | ORAL_TABLET | Freq: Four times a day (QID) | ORAL | 0 refills | Status: DC | PRN

## 2019-12-19 MED ORDER — METOCLOPRAMIDE HCL 10 MG PO TABS
10.0000 mg | ORAL_TABLET | Freq: Once | ORAL | Status: AC
  Administered 2019-12-19: 10 mg via ORAL
  Filled 2019-12-19: qty 1

## 2019-12-19 MED ORDER — CELECOXIB 200 MG PO CAPS: 200.0000 mg | ORAL_CAPSULE | Freq: Two times a day (BID) | ORAL | 0 refills | Status: DC

## 2019-12-19 MED ORDER — HYDROCODONE-ACETAMINOPHEN 5-325 MG PO TABS: 2.0000 | ORAL_TABLET | Freq: Once | ORAL | Status: DC

## 2019-12-19 MED ORDER — OXYCODONE-ACETAMINOPHEN 5-325 MG PO TABS
2.0000 | ORAL_TABLET | Freq: Once | ORAL | Status: DC
  Filled 2019-12-19: qty 2

## 2019-12-19 NOTE — ED Provider Notes (Signed)
[facility]-EMERGENCY DEPT Provider Note   CSN: [id number] Arrival date & time: 12/19/19  2025     History Chief Complaint  Patient presents with  . arm numbness    Andrew Villarreal is a 59 y.o. male.  Who presents emergency department with chief complaint of right arm pain.  Patient states that this began about 2 weeks ago.  He had an outpatient assessment had an x-ray done.  He complains of pain that is shooting down his arm into his first 3 fingers with associated weakness or numbness.  He states the pain is relieved when he leans his neck forward, worse whenever he tries to sit his head upright.  He has had loss of grip strength in the right hand.  Right-hand-dominant.  He denies any previous history of injuries.  He has been taking tramadol and gabapentin without any relief.  He finished a pack of prednisone which did help some but is now discontinued.  He set up for an MRI in a few weeks but states that his pain is so severe he could not take anymore came to the emergency department.  He denies  HPI     Past Medical History:  Diagnosis Date  . HTN (hypertension)   . Migraine   . Right frontal lobe mass    cyst  . Seizures ([facility])    No seizure since 2017.  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 Dental decay 06/12/2018  . Seizures ([facility])   . HTN (hypertension)     Past Surgical History:  Procedure Laterality Date  . CARDIOVASCULAR STRESS TEST     normal       Family History  Problem Relation Age of Onset  . Heart attack Father   . Heart disease Father   . Cancer Father        metastatic to bone.  . Bipolar disorder Mother        Hospitalized frequently  . Hypertension Sister   . COPD Brother        history of smoking    Social History   Tobacco Use  . Smoking status: Current Every Day Smoker    Packs/day: 1.00    Years: 38.00    Pack years: 38.00    Types: Cigarettes  . Smokeless tobacco: Never  Used  Substance Use Topics  . Alcohol use: Not Currently  . Drug use: No    Home Medications Prior to Admission medications   Medication Sig Start Date End Date Taking? Authorizing Provider  albuterol (VENTOLIN HFA) 108 (90 Base) MCG/ACT inhaler Inhale 2 puffs into the lungs every 6 (six) hours as needed for wheezing or shortness of breath. 09/26/19   Julieanne Manson, MD  amLODipine-benazepril (LOTREL) 10-40 MG capsule Take 1 capsule by mouth daily. 04/29/19   [provider]  Fluticasone-Salmeterol (ADVAIR DISKUS) 100-50 MCG/DOSE AEPB 1 inhalation twice daily then brush teeth and tongue 09/26/19   Julieanne Manson, MD  predniSONE (DELTASONE) 20 MG tablet Day 1-5: Take 2 tablets daily. Day 6-10: Take 1 tablet daily. Take tablets with breakfast. 12/05/19   Wallis Bamberg, PA-C  tiZANidine (ZANAFLEX) 4 MG tablet Take 1 tablet (4 mg total) by mouth every 8 (eight) hours as needed. 12/05/19   Wallis Bamberg, PA-C  traMADol (ULTRAM) 50 MG tablet Take 1 tablet (50 mg total) by mouth every 12 (twelve) hours as needed. 12/08/19   Lamptey, Britta Mccreedy, MD  umeclidinium bromide (INCRUSE ELLIPTA) 62.5 MCG/INH AEPB Inhale 1 puff into the lungs daily. 09/26/19   Mack Hook, MD  lisinopril (PRINIVIL,ZESTRIL) 20 MG tablet Take 1 tablet (20 mg total) by mouth daily. Patient not taking: Reported on 09/16/2019 06/24/18 12/05/19  Mack Hook, MD    Allergies    Clonazepam and Zofran Alvis Lemmings hcl]  Review of Systems   Review of Systems Ten systems reviewed and are negative for acute change, except as noted in the HPI.  Physical Exam Updated Vital Signs BP (!) 117/94 (BP Location: Left Arm)   Pulse 90   Temp 98.7 F (37.1 C) (Oral)   Resp 16   SpO2 98%   Physical Exam Vitals and nursing note reviewed.  Constitutional:      General: He is not in acute distress.    Appearance: He is well-developed. He is not diaphoretic.  HENT:     Head: Normocephalic and atraumatic.  Eyes:      General: No scleral icterus.    Extraocular Movements: Extraocular movements intact.     Conjunctiva/sclera: Conjunctivae normal.     Pupils: Pupils are equal, round, and reactive to light.  Cardiovascular:     Rate and Rhythm: Normal rate and regular rhythm.     Heart sounds: Normal heart sounds.  Pulmonary:     Effort: Pulmonary effort is normal. No respiratory distress.     Breath sounds: Normal breath sounds.  Abdominal:     Palpations: Abdomen is soft.     Tenderness: There is no abdominal tenderness.  Musculoskeletal:     Cervical back: Normal range of motion and neck supple.  Skin:    General: Skin is warm and dry.  Neurological:     Mental Status: He is alert.     Sensory: Sensory deficit present.     Motor: No weakness.     Comments: No observable weakness in the extremity.  Psychiatric:        Behavior: Behavior normal.     ED Results / Procedures / Treatments   Labs (all labs ordered are listed, but only abnormal results are displayed) Labs Reviewed - No data to display  EKG None  Radiology No results found.  Procedures Procedures (including critical care time)  Medications Ordered in ED Medications  oxyCODONE-acetaminophen (PERCOCET/ROXICET) 5-325 MG per tablet 2 tablet (has no administration in time range)  metoCLOPramide (REGLAN) tablet 10 mg (has no administration in time range)    ED Course  I have reviewed the triage vital signs and the nursing notes.  Pertinent labs & imaging results that were available during my care of the patient were reviewed by me and considered in my medical decision making (see chart for details).    MDM Rules/Calculators/A&P                      Patient here with right arm pain.  I personally ordered and reviewed images of the CT neck which show significant degenerative changes.  Patient also has some anterolisthesis and spinal stenosis.  Patient does not appear to have any fractures or unstable ligamentous injuries.   I reviewed the PDMP.  Patient will be given a prescription for Norco.  He has muscle relaxers at home.  Anti-inflammatories added.  Patient may follow-up with his orthopedist.  He appears otherwise appropriate for discharge at this time. Final Clinical Impression(s) / ED Diagnoses Final diagnoses:  None    Rx / DC Orders ED Discharge Orders  None       Arthor Captain, PA-C 12/19/19 2055    Derwood Kaplan, MD 12/20/19 1312

## 2019-12-19 NOTE — ED Triage Notes (Signed)
Pt reports that he was told has neck disc missing and having issues with right arm with pains. isnt supposed to be seen again until next week for it but reports pains are too bad and cant wait that long.

## 2020-01-07 ENCOUNTER — Ambulatory Visit: Payer: Self-pay | Admitting: Internal Medicine

## 2020-01-21 DIAGNOSIS — J181 Lobar pneumonia, unspecified organism: Secondary | ICD-10-CM | POA: Insufficient documentation

## 2020-01-21 DIAGNOSIS — U071 COVID-19: Secondary | ICD-10-CM | POA: Insufficient documentation

## 2020-01-21 DIAGNOSIS — J449 Chronic obstructive pulmonary disease, unspecified: Secondary | ICD-10-CM | POA: Insufficient documentation

## 2020-01-22 ENCOUNTER — Ambulatory Visit (INDEPENDENT_AMBULATORY_CARE_PROVIDER_SITE_OTHER): Payer: Self-pay | Admitting: Internal Medicine

## 2020-01-22 ENCOUNTER — Encounter: Payer: Self-pay | Admitting: Internal Medicine

## 2020-01-22 ENCOUNTER — Other Ambulatory Visit: Payer: Self-pay

## 2020-01-22 VITALS — BP 130/78 | HR 70 | Resp 12 | Ht 70.0 in | Wt 188.0 lb

## 2020-01-22 DIAGNOSIS — I10 Essential (primary) hypertension: Secondary | ICD-10-CM

## 2020-01-22 DIAGNOSIS — Z716 Tobacco abuse counseling: Secondary | ICD-10-CM | POA: Insufficient documentation

## 2020-01-22 DIAGNOSIS — E7800 Pure hypercholesterolemia, unspecified: Secondary | ICD-10-CM

## 2020-01-22 DIAGNOSIS — Z79899 Other long term (current) drug therapy: Secondary | ICD-10-CM

## 2020-01-22 DIAGNOSIS — R569 Unspecified convulsions: Secondary | ICD-10-CM

## 2020-01-22 DIAGNOSIS — G9389 Other specified disorders of brain: Secondary | ICD-10-CM | POA: Insufficient documentation

## 2020-01-22 DIAGNOSIS — J449 Chronic obstructive pulmonary disease, unspecified: Secondary | ICD-10-CM

## 2020-01-22 MED ORDER — NICOTINE 14 MG/24HR TD PT24: MEDICATED_PATCH | TRANSDERMAL | 0 refills | Status: DC

## 2020-01-22 MED ORDER — NICOTINE 21 MG/24HR TD PT24: MEDICATED_PATCH | TRANSDERMAL | 0 refills | Status: DC

## 2020-01-22 MED ORDER — NICOTINE 7 MG/24HR TD PT24: MEDICATED_PATCH | TRANSDERMAL | 0 refills | Status: DC

## 2020-01-22 NOTE — Progress Notes (Signed)
Subjective:    Patient ID: Andrew Villarreal, male   DOB: [birth year], 59 y.o.   MRN: [id number]   HPI   Here for medical clearance for surgery.  General anesthesia for Cervical discectomy and fusion, Level C5-6 per patient.  1.  COPD:  Smoking 1 ppd still.  States today he is stopping "cold [place]".  He has used patches in past, but did not get rid of smoking paraphernalia and did not make it out of the first 28 days without smoking.   Denies limitation to physical activity with walking, climbing stairs or hills with his breathing.   Feels Advair has helped.   Using Albuterol right after smoking first thing in morning.   Does not cough much  2.  Cardiac/Hypertension:  Has been controlled with combination Amlodipine-benazepril.   No chest pain even when walks an 18 hole golf course.  Swims laps all day long--apparently has a backyard swimming pool.  No chest pain with swimming as well.  No LE edema. No orthopnea or PND symptoms.   History of stress echo in 2010 that was normal.  3.  Right temporal lobe mass/cyst:  Last visualized in 2017 and stable at 5 mm since 2006 MRI--neuroglial cyst.    Apparently had a seizure last 2017, reportedly felt secondary to the cyst/mass  Current Meds  Medication Sig  . albuterol (VENTOLIN HFA) 108 (90 Base) MCG/ACT inhaler Inhale 2 puffs into the lungs every 6 (six) hours as needed for wheezing or shortness of breath.  Marland Kitchen amLODipine-benazepril (LOTREL) 10-40 MG capsule Take 1 capsule by mouth daily.  . celecoxib (CELEBREX) 200 MG capsule Take 1 capsule (200 mg total) by mouth 2 (two) times daily.  . Fluticasone-Salmeterol (ADVAIR DISKUS) 100-50 MCG/DOSE AEPB 1 inhalation twice daily then brush teeth and tongue  . Ibuprofen (ADVIL) 200 MG CAPS As needed   Allergies  Allergen Reactions  . Clonazepam   . Zofran [Ondansetron Hcl] Hives    Broke out in hives immediately after injection.      Review of Systems  Constitutional: Negative for appetite  change, fatigue and fever.  HENT: Negative for dental problem (All teeth pulled and with dentures now.  ).   Respiratory:       Tested + for COVID 19 on 09/02/2019, treated with OP Bamlanivimab and cleared on repeat 09/10/2019.  Subsequently developed RLL pneumonia with clear CXR, but RLL air space opacity on CT angio 09/16/2019. Treated OP with oral Azithromycin.   No respiratory complaints save when smoking in morning at this time.  Cardiovascular: Negative for chest pain, palpitations and leg swelling.       No leg pain with walking long distances.  Gastrointestinal: Negative for abdominal pain and blood in stool (No melena).  Genitourinary: Negative for decreased urine volume, dysuria and frequency.       No ED symptoms.      Objective:   BP 130/78 (BP Location: Left Arm, Patient Position: Sitting, Cuff Size: Normal)   Pulse 70   Resp 12   Ht 5\' 10"  (1.778 m)   Wt 188 lb (85.3 kg)   BMI 26.98 kg/m   Physical Exam NAD HEENT:  PERRL, EOMI, TMs pearly gray, throat without injection. Neck:  Supple, No adenopathy, no thyromegaly Chest:  CTA with good air movement CV:  RRR with normal S1 and S2, No S3, S4 or murmur.  No carotid bruits.  Carotid, radial, femoral, DP and PT pulses normal and equal Abd:  S,  NT, No HSM or mass, + BS LE:  No edema.  Assessment & Plan  1.  COPD:  Very stable by history in a very active individual since COVID illness and pneumonia. Discussed absolutely needs to quit smoking now for best pulmonary outcome with general anesthesia. Nicotine patches to be used to support.  Discussed how to use.   Has already rid environment of smoking supplies.   Check CXR, though clear with RLL pneumonia findings on CT angio.  2.  Cardiac/hypertension:  ECG normal.  BP controlled.  No concerns.  3.  History of right temporal brain cyst with seizure.  No seizure since 2017 and no change in cyst from 2009 to 2017.  No concern for surgery.  4.  History of mild  hypercholesterolemia:  Lipid profile, nonfasting  CBC, CMP, LP pending

## 2020-01-23 LAB — COMPREHENSIVE METABOLIC PANEL
ALT: 22 IU/L (ref 0–44)
AST: 18 IU/L (ref 0–40)
Albumin/Globulin Ratio: 1.9 (ref 1.2–2.2)
Albumin: 4.2 g/dL (ref 3.8–4.9)
Alkaline Phosphatase: 57 IU/L (ref 48–121)
BUN/Creatinine Ratio: 10 (ref 9–20)
BUN: 9 mg/dL (ref 6–24)
Bilirubin Total: 0.4 mg/dL (ref 0.0–1.2)
CO2: 25 mmol/L (ref 20–29)
Calcium: 9.3 mg/dL (ref 8.7–10.2)
Chloride: 104 mmol/L (ref 96–106)
Creatinine, Ser: 0.88 mg/dL (ref 0.76–1.27)
GFR calc Af Amer: 109 mL/min/{1.73_m2} (ref >59)
GFR calc non Af Amer: 95 mL/min/{1.73_m2} (ref >59)
Globulin, Total: 2.2 g/dL (ref 1.5–4.5)
Glucose: 94 mg/dL (ref 65–99)
Potassium: 4.3 mmol/L (ref 3.5–5.2)
Sodium: 142 mmol/L (ref 134–144)
Total Protein: 6.4 g/dL (ref 6.0–8.5)

## 2020-01-23 LAB — CBC WITH DIFFERENTIAL/PLATELET
Basophils Absolute: 0 10*3/uL (ref 0.0–0.2)
Basos: 0 %
EOS (ABSOLUTE): 0.1 10*3/uL (ref 0.0–0.4)
Eos: 1 %
Hematocrit: 44.5 % (ref 37.5–51.0)
Hemoglobin: 14.7 g/dL (ref 13.0–17.7)
Immature Grans (Abs): 0 10*3/uL (ref 0.0–0.1)
Immature Granulocytes: 0 %
Lymphocytes Absolute: 4.4 10*3/uL — ABNORMAL HIGH (ref 0.7–3.1)
Lymphs: 43 %
MCH: 32.3 pg (ref 26.6–33.0)
MCHC: 33 g/dL (ref 31.5–35.7)
MCV: 98 fL — ABNORMAL HIGH (ref 79–97)
Monocytes Absolute: 0.8 10*3/uL (ref 0.1–0.9)
Monocytes: 8 %
Neutrophils Absolute: 4.8 10*3/uL (ref 1.4–7.0)
Neutrophils: 48 %
Platelets: 194 10*3/uL (ref 150–450)
RBC: 4.55 x10E6/uL (ref 4.14–5.80)
RDW: 13.1 % (ref 11.6–15.4)
WBC: 10.2 10*3/uL (ref 3.4–10.8)

## 2020-01-23 LAB — LIPID PANEL W/O CHOL/HDL RATIO
Cholesterol, Total: 227 mg/dL — ABNORMAL HIGH (ref 100–199)
HDL: 40 mg/dL (ref >39)
LDL Chol Calc (NIH): 154 mg/dL — ABNORMAL HIGH (ref 0–99)
Triglycerides: 180 mg/dL — ABNORMAL HIGH (ref 0–149)
VLDL Cholesterol Cal: 33 mg/dL (ref 5–40)

## 2020-01-27 ENCOUNTER — Ambulatory Visit
Admission: RE | Admit: 2020-01-27 | Discharge: 2020-01-27 | Disposition: A | Discharge status: Home / Self Care | Payer: No Typology Code available for payment source | Source: Ambulatory Visit | Attending: Internal Medicine | Admitting: Internal Medicine

## 2020-01-27 ENCOUNTER — Other Ambulatory Visit: Payer: Self-pay

## 2020-01-27 DIAGNOSIS — J449 Chronic obstructive pulmonary disease, unspecified: Secondary | ICD-10-CM

## 2020-01-29 ENCOUNTER — Ambulatory Visit: Payer: Self-pay | Admitting: Orthopedic Surgery

## 2020-02-09 ENCOUNTER — Ambulatory Visit: Payer: Self-pay | Admitting: Orthopedic Surgery

## 2020-02-09 NOTE — H&P (Signed)
Subjective:   Andrew Villarreal is a very pleasant 59 year old male with past medical history significant for tobacco use disorder (Recently on nicotine patch, has discontinued nicotine patch and is no longer using any nicotine products), COPD (on albuterol), hypertension (on amlodipine) who was in his normal state of health until approximately 6 weeks ago began experiencing severe neck pain and radicular right arm pain without any injury. He states the arm pain is more bothersome than the neck pain and rates it as a 10/10. He states the pain is constant. Despite appropriate conservative care including a Medrol Dosepak, injection therapy, prescription and over-the-counter medications the patient continues to have severe debilitating neck pain and radicular right arm pain. He would therefore like to move forward with surgical intervention. The patient is here today for a pre-operative History and Physical. They are scheduled for ACDF C5-6 on 02-18-20 with Dr. Shon Baton at [facility].  Patient Active Problem List   Diagnosis Date Noted  . Tobacco abuse counseling 01/22/2020  . Right temporal lobe mass   . Chronic obstructive pulmonary disease (HCC) 01/21/2020  . COVID-19 01/21/2020  . Lobar pneumonia (HCC) 01/21/2020  . Dental decay 06/12/2018  . Seizures (HCC)   . Essential hypertension    Past Medical History:  Diagnosis Date  . HTN (hypertension)   . Migraine   . Right temporal lobe mass    cyst  . Seizures (HCC)    No seizure since 2017.  Right temporal lobe mass--stable for many years    Past Surgical History:  Procedure Laterality Date  . CARDIOVASCULAR STRESS TEST     normal    Current Outpatient Medications  Medication Sig Dispense Refill Last Dose  . albuterol (VENTOLIN HFA) 108 (90 Base) MCG/ACT inhaler Inhale 2 puffs into the lungs every 6 (six) hours as needed for wheezing or shortness of breath. 18 g 6   . amLODipine-benazepril (LOTREL) 10-40 MG capsule Take 1 capsule by mouth  daily.     . celecoxib (CELEBREX) 200 MG capsule Take 1 capsule (200 mg total) by mouth 2 (two) times daily. (Patient not taking: Reported on 02/06/2020) 20 capsule 0   . Fluticasone-Salmeterol (ADVAIR DISKUS) 100-50 MCG/DOSE AEPB 1 inhalation twice daily then brush teeth and tongue (Patient taking differently: Inhale 1 puff into the lungs 2 (two) times daily as needed (shortness of breath). brush teeth and tongue after use) 60 each 11   . HYDROcodone-acetaminophen (NORCO) 5-325 MG tablet Take 1-2 tablets by mouth every 6 (six) hours as needed for moderate pain. (Patient not taking: Reported on 01/22/2020) 20 tablet 0   . nicotine (NICODERM CQ - DOSED IN MG/24 HOURS) 14 mg/24hr patch Place 1 patch to skin after completion of 21 mg patches and change every 24 hours for 14 days then decrease to 7 mg patch 14 patch 0   . nicotine (NICODERM CQ - DOSED IN MG/24 HOURS) 21 mg/24hr patch Apply 1 patch to skin and change every 24 hours for 28 days then decrease to 14 mg patch (Patient taking differently: Place 21 mg onto the skin daily. for 28 days then decrease to 14 mg patch) 28 patch 0   . nicotine (NICODERM CQ - DOSED IN MG/24 HR) 7 mg/24hr patch Place 1 patch to skin after completion of 14 mg patches every 24 hours for 14 days then stop patches 28 patch 0   . predniSONE (DELTASONE) 20 MG tablet Day 1-5: Take 2 tablets daily. Day 6-10: Take 1 tablet daily. Take tablets with  breakfast. (Patient not taking: Reported on 01/22/2020) 10 tablet 0   . tiZANidine (ZANAFLEX) 4 MG tablet Take 1 tablet (4 mg total) by mouth every 8 (eight) hours as needed. (Patient not taking: Reported on 01/22/2020) 30 tablet 0   . umeclidinium bromide (INCRUSE ELLIPTA) 62.5 MCG/INH AEPB Inhale 1 puff into the lungs daily. (Patient not taking: Reported on 01/22/2020) 30 each 11    No current facility-administered medications for this visit.   Allergies  Allergen Reactions  . Clonazepam Hives and Swelling  . Zofran [Ondansetron Hcl] Hives  and Swelling    Broke out in hives immediately after injection.     Social History   Tobacco Use  . Smoking status: Current Every Day Smoker    Packs/day: 1.00    Years: 38.00    Pack years: 38.00    Types: Cigarettes  . Smokeless tobacco: Never Used  Substance Use Topics  . Alcohol use: Not Currently    Family History  Problem Relation Age of Onset  . Heart attack Father   . Heart disease Father   . Cancer Father        metastatic to bone.  . Bipolar disorder Mother        Hospitalized frequently  . Hypertension Sister   . COPD Brother        history of smoking    Review of Systems As stated in HPI  Objective:   Vitals: Ht: 5 ft 9 in 02/09/2020 10:50 am Wt: 193 lbs 02/09/2020 10:50 am BMI: 28.5 02/09/2020 10:50 am BP: 133/84 sitting R arm 02/09/2020 10:51 am Pulse: 57 bpm 02/09/2020 10:51 am  Patient is a 59 year old male.  General: AAOX3, well developed and well nourished, Bracing posture holding his right arm that across his chest.  Psych: Normal mood, affect appropriate  Ambulation: normal gait pattern, uses no assistive device.  Inspection: No obvious deformity, scoleosis, kyphosis, loss of lordotic curve.  Heart: Regular rate and rhythm, no rubs, murmurs, or gallops  Lungs: Clear to auscultation bilaterally  Abdomen: Bowel soundds 4, nontender, nondistended, no rebound tenderness. No loss of bladder or bowel control.  Palpation: Tender over spinous processes and neck musculature.  AROM: - Significant pain with range of motion especially lateral bending and rotation to the right -Shoulder, elbow, and wrists AROM normal and pain free.  Dermatomes: Patient describing severe pain and dysesthesias in the right arm C6 dermatome pattern  Myotomes: - shoulder shrug: Left 5/5, Right 5/5 -Shoulder Abduction: Left 5/5, Right 5/5 - Elbow flexion: Left 5/5, Right 5/5 - Elbow extension Left 5/5, Right 5/5 - Wrist extension: Left 5/5, right 5/5 - Finger  abduction: Left 5/5, Right 5/5 - finger Adduction/squeeze: Left 5/5, Right 5/5  Reflexes: - Biceps: Left1+, Right 1+ - Brachioradialius: Left1+, Right 1+ - Triceps: Left 1+, Right 1+ - Hoffman's: Negative - Babinski: Left Ngative, Right Negative - Clonus: Negative  Special Tests: - UE Nural tension test: Left Negative, Right Positive  PV: Extremities warm and well profused. 2+ radial pulses  X-Ray impression: AP and lateral cervical spine x-rays were taken at today's office visit and images were personally reviewed by me. The patient does have severe degenerative disc disease at C5-6. No obvious compression deformity. No significant slip.  Cervical MRI: completed on 12/25/19 was reviewed with the patient. It was completed at [facility]; I have independently reviewed the images as well as the radiology report. No cord signal changes. No evidence of fracture or abnormal marrow signal change. Mild degenerative  disease throughout the cervical spine except for C5-6. Patient has severe right and moderate to severe left C5-6 neural foraminal narrowing causing impingement on C6 nerve root right side worse than left.  Patient reports significant, although temporary relief following a right C6 selective nerve root block indicating that this is the patient's primary pain generator.  Assessment:   Andrew Villarreal is a very pleasant 59 year old male with past medical history significant for tobacco use disorder (Recently on nicotine patch, has discontinued nicotine patch and is no longer using any nicotine products), COPD (on albuterol), hypertension (on amlodipine) who was in his normal state of health until approximately 6 weeks ago began experiencing severe neck pain and radicular right arm pain without any injury. On exam, the patient has radicular arm pain in the right C6 dermatome pattern, no significant weakness. Positive nerve tension signs. This does correlate with MRI findings of severe right and moderate  to severe left C5-6 neural foraminal narrowing causing impingement on C6 nerve root right side worse than left. Despite appropriate conservative care including a Medrol Dosepak, injection therapy, over-the-counter medications, and prescription medications the patient continues to have severe, debilitating pain that is interfering with his quality-of-life and therefore he would like to move forward with surgical intervention.  Plan:   Total disc replacement C5-6, plan is to be allowed to fusion total disc replacement is not sitting properly.  Risks and benefits of surgery were discussed with the patient. These include: Infection, bleeding, death, stroke, paralysis, ongoing or worse pain, need for additional surgery, nonunion, leak of spinal fluid, adjacent segment degeneration requiring additional fusion surgery. Pseudoarthrosis (nonunion)requiring supplemental posterior fixation. Throat pain, swallowing difficulties, hoarseness or change in voice.   With respect to disc replacement: Additional risks include heterotopic ossification, inability to place the disc due to technical issues requiring bailout to a fusion procedure.  We received preoperative medical clearance from the patient's primary care provider. Patient states he was on a nicotine patch, but he has gone several days without it without using any nicotine products. He states that he contact his primary care provider to see if he could discontinue this altogether, but unfortunately the patient's primary care doctor is out on medical leave and does not have a partner. Discussed with the patient and his wife that in regards to surgical intervention, any nicotine products increases his risks and the preference would be no nicotine products. However, I would rather him be on a nicotine patch and using cigarettes. I told him that the decision would ultimately be up to him if he wanted to discontinue the nicotine. Both the patient and wife expressed  understanding of this.  I have also reviewed the patient's medication list with him. He is not on any blood thinners. Not using aspirin products. He has already discontinued Advil. I reiterated with the patient that he is to avoid any anti-inflammatory medications and aspirin products leading up to surgical intervention as this can thin his blood increase his risk of bleeding. Recommend he take over-the-counter Tylenol as needed for pain. He did express understanding of this.  We have also discussed the post-operative recovery period to include: bathing/showering restrictions, wound healing, activity (and driving) restrictions, medications/pain mangement. Patient does not tolerate Percocet well. He states that he has tolerated hydrocodone well in the past. Zofran gives him a rash.  We have also discussed post-operative redflags to include: signs and symptoms of postoperative infection, DVT/PE.  Patient was provided a soft cervical collar at today's office visit.  Patient  [facility].  Patient's wife was available via face time all of her questions as well as the patient's questions were invited and answered at today's visit.  Follow-up: 2 weeks postoperatively

## 2020-02-09 NOTE — H&P (Deleted)
  The note originally documented on this encounter has been moved the the encounter in which it belongs.  

## 2020-02-10 NOTE — Pre-Procedure Instructions (Signed)
Your procedure is scheduled on Wednesday, July 28th.  Report to [facility] Main Entrance "A" at 8:30 A.M., and check in at the Admitting office.  Call this number if you have problems the morning of surgery:  [phone]  Call [phone] if you have any questions prior to your surgery date Monday-Friday 8am-4pm    Remember:  Do not eat and drink after midnight the night before your surgery    Take these medicines the morning of surgery with A SIP OF WATER: NONE Fluticasone-Salmeterol (ADVAIR DISKUS) Use Albuterol Inhaler if needed. Please bring with you to the hospital.    As of today, STOP taking any Aspirin (unless otherwise instructed by your surgeon) Aleve, Naproxen, Ibuprofen, Motrin, Advil, Goody's, BC's, all herbal medications, fish oil, and all vitamins.                     Do not wear jewelry.            Do not wear lotions, powders, colognes, or deodorant.            Men may shave face and neck.            Do not bring valuables to the hospital.            [facility] is not responsible for any belongings or valuables.  Do NOT Smoke (Tobacco/Vaping) or drink Alcohol 24 hours prior to your procedure If you use a CPAP at night, you may bring all equipment for your overnight stay.   Contacts, glasses, dentures or bridgework may not be worn into surgery.      For patients admitted to the hospital, discharge time will be determined by your treatment team.   Patients discharged the day of surgery will not be allowed to drive home, and someone needs to stay with them for 24 hours.    Special instructions:   [facility]- Preparing For Surgery  Before surgery, you can play an important role. Because skin is not sterile, your skin needs to be as free of germs as possible. You can reduce the number of germs on your skin by washing with CHG (chlorahexidine gluconate) Soap before surgery.  CHG is an antiseptic cleaner which kills germs and bonds with the skin to continue  killing germs even after washing.    Oral Hygiene is also important to reduce your risk of infection.  Remember - BRUSH YOUR TEETH THE MORNING OF SURGERY WITH YOUR REGULAR TOOTHPASTE  Please do not use if you have an allergy to CHG or antibacterial soaps. If your skin becomes reddened/irritated stop using the CHG.  Do not shave (including legs and underarms) for at least 48 hours prior to first CHG shower. It is OK to shave your face.  Please follow these instructions carefully.   1. Shower the NIGHT BEFORE SURGERY and the MORNING OF SURGERY with CHG Soap.   2. If you chose to wash your hair, wash your hair first as usual with your normal shampoo.  3. After you shampoo, rinse your hair and body thoroughly to remove the shampoo.  4. Use CHG as you would any other liquid soap. You can apply CHG directly to the skin and wash gently with a scrungie or a clean washcloth.   5. Apply the CHG Soap to your body ONLY FROM THE NECK DOWN.  Do not use on open wounds or open sores. Avoid contact with your eyes, ears, mouth and genitals (private parts). Wash  Face and genitals (private parts)  with your normal soap.   6. Wash thoroughly, paying special attention to the area where your surgery will be performed.  7. Thoroughly rinse your body with warm water from the neck down.  8. DO NOT shower/wash with your normal soap after using and rinsing off the CHG Soap.  9. Pat yourself dry with a CLEAN TOWEL.  10. Wear CLEAN PAJAMAS to bed the night before surgery  11. Place CLEAN SHEETS on your bed the night of your first shower and DO NOT SLEEP WITH PETS.   Day of Surgery: Wear Clean/Comfortable clothing the morning of surgery Do not apply any deodorants/lotions.   Remember to brush your teeth WITH YOUR REGULAR TOOTHPASTE.   Please read over the following fact sheets that you were given.

## 2020-02-11 ENCOUNTER — Other Ambulatory Visit: Payer: Self-pay

## 2020-02-11 ENCOUNTER — Ambulatory Visit (HOSPITAL_COMMUNITY)
Admission: RE | Admit: 2020-02-11 | Discharge: 2020-02-11 | Disposition: A | Discharge status: Home / Self Care | Payer: 59 | Source: Ambulatory Visit | Attending: Orthopedic Surgery | Admitting: Orthopedic Surgery

## 2020-02-11 ENCOUNTER — Encounter (HOSPITAL_COMMUNITY): Payer: Self-pay

## 2020-02-11 ENCOUNTER — Encounter (HOSPITAL_COMMUNITY)
Admission: RE | Admit: 2020-02-11 | Discharge: 2020-02-11 | Disposition: A | Discharge status: Home / Self Care | Payer: 59 | Source: Ambulatory Visit | Attending: Orthopedic Surgery | Admitting: Orthopedic Surgery

## 2020-02-11 DIAGNOSIS — Z01818 Encounter for other preprocedural examination: Secondary | ICD-10-CM | POA: Insufficient documentation

## 2020-02-11 HISTORY — DX: Dyspnea, unspecified: R06.00

## 2020-02-11 HISTORY — DX: Unspecified asthma, uncomplicated: J45.909

## 2020-02-11 HISTORY — DX: Chronic obstructive pulmonary disease, unspecified: J44.9

## 2020-02-11 LAB — CBC
HCT: 48.8 % (ref 39.0–52.0)
Hemoglobin: 16.1 g/dL (ref 13.0–17.0)
MCH: 32.4 pg (ref 26.0–34.0)
MCHC: 33 g/dL (ref 30.0–36.0)
MCV: 98.2 fL (ref 80.0–100.0)
Platelets: 237 10*3/uL (ref 150–400)
RBC: 4.97 MIL/uL (ref 4.22–5.81)
RDW: 13 % (ref 11.5–15.5)
WBC: 9.1 10*3/uL (ref 4.0–10.5)
nRBC: 0 % (ref 0.0–0.2)

## 2020-02-11 LAB — URINALYSIS, ROUTINE W REFLEX MICROSCOPIC
Bacteria, UA: NONE SEEN
Bilirubin Urine: NEGATIVE
Glucose, UA: >=500 mg/dL — AB
Hgb urine dipstick: NEGATIVE
Ketones, ur: NEGATIVE mg/dL
Leukocytes,Ua: NEGATIVE
Nitrite: NEGATIVE
Protein, ur: NEGATIVE mg/dL
Specific Gravity, Urine: 1.015 (ref 1.005–1.030)
pH: 5 (ref 5.0–8.0)

## 2020-02-11 LAB — BASIC METABOLIC PANEL
Anion gap: 10 (ref 5–15)
BUN: 6 mg/dL (ref 6–20)
CO2: 27 mmol/L (ref 22–32)
Calcium: 9.7 mg/dL (ref 8.9–10.3)
Chloride: 103 mmol/L (ref 98–111)
Creatinine, Ser: 0.92 mg/dL (ref 0.61–1.24)
GFR calc Af Amer: >60 mL/min (ref >60)
GFR calc non Af Amer: >60 mL/min (ref >60)
Glucose, Bld: 90 mg/dL (ref 70–99)
Potassium: 3.9 mmol/L (ref 3.5–5.1)
Sodium: 140 mmol/L (ref 135–145)

## 2020-02-11 LAB — APTT: aPTT: 26 seconds (ref 24–36)

## 2020-02-11 LAB — PROTIME-INR
INR: 1 (ref 0.8–1.2)
Prothrombin Time: 12.3 seconds (ref 11.4–15.2)

## 2020-02-11 LAB — SURGICAL PCR SCREEN
MRSA, PCR: NEGATIVE
Staphylococcus aureus: NEGATIVE

## 2020-02-11 NOTE — Progress Notes (Signed)
PCP - Julieanne Manson, MD Cardiologist - Denies  PPM/ICD - Denies  Chest x-ray - 02/11/20 EKG - 01/22/20 Stress Test - 2010 ECHO - 2010 Cardiac Cath - Denies  Sleep Study - Denies  Patient denies being diabetic.  Blood Thinner Instructions: N/A Aspirin Instructions: N/A  ERAS Protcol - No PRE-SURGERY Ensure or G2- N/A  COVID TEST- 02/18/20   Anesthesia review: Yes, per MD order.  Patient denies shortness of breath, fever, cough and chest pain at PAT appointment   All instructions explained to the patient, with a verbal understanding of the material. Patient agrees to go over the instructions while at home for a better understanding. Patient also instructed to self quarantine after being tested for COVID-19. The opportunity to ask questions was provided.

## 2020-02-11 NOTE — Progress Notes (Signed)
[facility], PA-C about abnormal UA.

## 2020-02-12 NOTE — Progress Notes (Signed)
Anesthesia Chart Review:  Case: [id number] Date/Time: 02/18/20 0715   Procedure: CERVICAL ANTERIOR DISC ARTHROPLASTY C5-6 (N/A ) - 2.5 hrs   Anesthesia type: General   Pre-op diagnosis: Cervical radiculopathy   Location: [facility] ROOM 04 / [facility] OR   Surgeons: Venita Lick, MD      DISCUSSION: Patient is a 59 year old Villarreal scheduled for the above procedure.  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 2010 LHC showed 20-30% mid RCA with distal luminal irregularities, otherwise normal coronaries.  He was evaluated by Dr. Delrae Alfred on 01/22/2020 for medical clearance for surgery.  COPD felt stable. She discussed smoking cessation prior to surgery. Breathing did not limit his activity.  Reported no chest pain with lap swimming or walking 18-hole golf course.  EKG was normal. No recent seizures. She ordered a CXR and "if you Xray is clear, will send clearance for surgery...". 01/27/20 CXR showed no acute process.  Presurgical COVID-19 test is scheduled for 02/16/20.  Anesthesia team to evaluate on the day of surgery.   VS: BP 120/86   Pulse 62   Temp 36.7 C   Resp 18   Ht 5\' 10"  (1.778 m)   Wt 85.1 kg   SpO2 97%   BMI 26.91 kg/m     PROVIDERS: , MD is PCP ([facility])   LABS: Labs reviewed: Acceptable for surgery. (all labs ordered are listed, but only abnormal results are displayed)  Labs Reviewed  URINALYSIS, ROUTINE W REFLEX MICROSCOPIC - Abnormal; Notable for the following components:      Result Value   Glucose, UA >=500 (*)    All other components within normal limits  SURGICAL PCR SCREEN  APTT  BASIC METABOLIC PANEL  CBC  PROTIME-INR     IMAGES: CXR 02/11/20:  FINDINGS: Lung volumes are normal.  No consolidative airspace disease. No pleural effusions. No pneumothorax. No pulmonary nodule or mass noted. Pulmonary vasculature and the cardiomediastinal silhouette are within normal limits. IMPRESSION: No radiographic evidence of acute cardiopulmonary disease.  CXR 01/27/20: FINDINGS: Mediastinum and hilar structures normal. Low lung volumes. Mild right base subsegmental atelectasis and or scarring again noted. No pleural effusion or pneumothorax. No acute bony abnormality. IMPRESSION: Low lung volumes. Mild right base subsegmental atelectasis and or scarring again noted. No acute cardiopulmonary disease.  CT C-spine 12/19/19: IMPRESSION: Mild grade 1 retrolisthesis of C5-6 secondary to moderate degenerative disc disease at this level. This most likely results in some degree of bilateral neural foraminal stenosis at this level secondary to uncovertebral spurring. No acute abnormality seen in the cervical spine.  CTA chest 09/16/19: IMPRESSION: 1. No demonstrable pulmonary embolus. No thoracic aortic aneurysm or dissection. 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 2. Underlying centrilobular emphysematous change with upper lobe scattered bullae. Bibasilar atelectasis. Focus of airspace opacity consistent with pneumonia in a portion of the superior segment right lower lobe. 3. Prominent pericarinal and subcarinal lymph nodes of uncertain etiology.  CT Head 01/14/16: IMPRESSION: 1.  No evidence of acute intracranial abnormality. 2. 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 3. Paranasal sinusitis, probably chronic.      EKG: 01/22/20: NSR.  CV: Echo 02/11/10: Conclusions: - Left ventricle: The cavity size was normal.  Wall thickness was normal.  Systolic function was normal.  The estimated ejection fraction was in the range of 55 to  60%.  Wall motion was normal.  There was no regional wall motion abnormalities.  Left ventricular diastolic function parameters were normal. 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 - Trivial mitral and tricuspid regurgitation.  Cardiac cath 08/18/08:  FINDINGS:  1. Left Main:  Normal.  2. LAD:  Normal.  3. D1:  Normal.  4. Ramus intermedius:  Large vessel, normal appearing.  5. LCx:  Codominant, normal appearing.  6. RCA:  Anterior takeoff, mid 20-30% stenosis at a bend, distal RCA      and PDA showed mild luminal irregularities.  7. LV:  EF of 65-70%, no wall motion abnormalities, LVEDP is 16 mmHg.  8. Aortic root shot is normal.  No evidence of enlargement or      dissection noted. IMPRESSION:  1. Nonobstructive coronary arteries.  2. Vigorous left ventricular systolic function, ejection fraction of      65-70%.  3. Normal-appearing aortic root. PLAN:  At this time, I would recommend aggressive risk factor  modification as indicated.  Normal exercise stress echocardiograms on 02/25/08 and 08/13/08.     Past Medical History:  Diagnosis Date  . Asthma   . COPD (chronic obstructive pulmonary disease) (HCC)   . Dyspnea   . HTN (hypertension)   . Migraine   . Pneumonia 2020  . Right temporal lobe mass    cyst  . Seizures (HCC)    No seizure since 2017.  Right temporal lobe mass--stable for many years    Past Surgical History:  Procedure Laterality Date  . CARDIOVASCULAR STRESS TEST     normal    MEDICATIONS: . albuterol (VENTOLIN HFA) 108 (90 Base) MCG/ACT inhaler  . amLODipine-benazepril (LOTREL) 10-40 MG capsule  . celecoxib (CELEBREX) 200 MG capsule  . Fluticasone-Salmeterol (ADVAIR DISKUS) 100-50 MCG/DOSE AEPB  . HYDROcodone-acetaminophen (NORCO) 5-325 MG tablet  . nicotine (NICODERM CQ - DOSED IN MG/24 HOURS) 14 mg/24hr patch  . nicotine (NICODERM CQ - DOSED IN MG/24 HOURS) 21 mg/24hr patch  . nicotine (NICODERM CQ - DOSED IN MG/24 HR) 7 mg/24hr  patch  . predniSONE (DELTASONE) 20 MG tablet  . tiZANidine (ZANAFLEX) 4 MG tablet  . umeclidinium bromide (INCRUSE ELLIPTA) 62.5 MCG/INH AEPB   No current facility-administered medications for this encounter.   He is not currently taking Celebrex, Norco, prednisone, Zanaflex, Incruse Ellipta.  He had not started NicoDerm CQ as of 7 1621.  Shonna Chock, PA-C Surgical Short Stay/Anesthesiology [facility] Phone [phone] [facility] Phone [phone] 02/12/2020 4:14 PM

## 2020-02-12 NOTE — Anesthesia Preprocedure Evaluation (Addendum)
Anesthesia Evaluation  Patient identified by MRN, date of birth, ID band Patient awake    Reviewed: Allergy & Precautions, NPO status , Patient's Chart, lab work & pertinent test results  Airway Mallampati: I  TM Distance: >3 FB Neck ROM: Limited    Dental no notable dental hx. (+) Edentulous Upper, Partial Lower, Missing,    Pulmonary neg pulmonary ROS, COPD,  COPD inhaler, Patient abstained from smoking., former smoker,    Pulmonary exam normal breath sounds clear to auscultation       Cardiovascular hypertension, Pt. on medications negative cardio ROS Normal cardiovascular exam Rhythm:Regular Rate:Normal  EKG: 01/22/20: NSR.   Echo 02/11/10: - Left ventricle: The cavity size was normal.  Wall thickness was normal.  Systolic function was normal.  The estimated ejection fraction was in the range of 55 to 60%.  Wall motion was normal.  There was no regional wall motion abnormalities.  Left ventricular diastolic function parameters were normal. 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 - Trivial mitral and tricuspid regurgitation.  Cardiac cath 08/18/08: 1. Left Main: Normal. 2. LAD: Normal. 3. D1: Normal. 4. Ramus intermedius: Large vessel, normal appearing. 5. LCx: Codominant, normal appearing. 6. RCA: Anterior takeoff, mid 20-30% stenosis at a bend, distal RCA and PDA showed mild luminal irregularities. 7. LV: EF of 65-70%, no wall motion abnormalities, LVEDP is 16 mmHg. 8. Aortic root shot is normal. No evidence of enlargement or dissection noted. IMPRESSION: 1. Nonobstructive coronary arteries. 2. Vigorous left ventricular systolic function, ejection fraction of 65-70%. 3. Normal-appearing aortic root. PLAN: At this time, I would recommend aggressive risk factor modification as indicated.    Neuro/Psych  Headaches, Seizures -, Well Controlled,  negative neurological  ROS  negative psych ROS   GI/Hepatic negative GI ROS, Neg liver ROS,   Endo/Other  negative endocrine ROS  Renal/GU negative Renal ROS  negative genitourinary   Musculoskeletal negative musculoskeletal ROS (+)   Abdominal   Peds negative pediatric ROS (+)  Hematology negative hematology ROS (+)   Anesthesia Other Findings   Reproductive/Obstetrics negative OB ROS                           Anesthesia Physical Anesthesia Plan  ASA: III  Anesthesia Plan: General   Post-op Pain Management:    Induction: Intravenous  PONV Risk Score and Plan: 2 and Dexamethasone and Treatment may vary due to age or medical condition  Airway Management Planned: Oral ETT and Video Laryngoscope Planned  Additional Equipment:   Intra-op Plan:   Post-operative Plan: Extubation in OR  Informed Consent:   Plan Discussed with: CRNA and Anesthesiologist  Anesthesia Plan Comments: (PAT note written 02/12/2020 by Shonna Chock, PA-C. )     Anesthesia Quick Evaluation

## 2020-02-16 ENCOUNTER — Ambulatory Visit: Payer: Self-pay | Admitting: Internal Medicine

## 2020-02-16 ENCOUNTER — Other Ambulatory Visit (HOSPITAL_COMMUNITY)
Admission: RE | Admit: 2020-02-16 | Discharge: 2020-02-16 | Disposition: A | Discharge status: Home / Self Care | Payer: 59 | Source: Ambulatory Visit | Attending: Orthopedic Surgery | Admitting: Orthopedic Surgery

## 2020-02-16 DIAGNOSIS — Z20822 Contact with and (suspected) exposure to covid-19: Secondary | ICD-10-CM | POA: Insufficient documentation

## 2020-02-16 DIAGNOSIS — Z01812 Encounter for preprocedural laboratory examination: Secondary | ICD-10-CM | POA: Diagnosis present

## 2020-02-16 LAB — SARS CORONAVIRUS 2 (TAT 6-24 HRS): SARS Coronavirus 2: NEGATIVE

## 2020-02-18 ENCOUNTER — Ambulatory Visit (HOSPITAL_COMMUNITY): Payer: 59 | Admitting: Anesthesiology

## 2020-02-18 ENCOUNTER — Encounter (HOSPITAL_COMMUNITY)
Admission: RE | Disposition: A | Discharge status: Home / Self Care | Payer: Self-pay | Source: Home / Self Care | Attending: Orthopedic Surgery

## 2020-02-18 ENCOUNTER — Ambulatory Visit (HOSPITAL_COMMUNITY): Payer: 59

## 2020-02-18 ENCOUNTER — Encounter (HOSPITAL_COMMUNITY): Payer: Self-pay | Admitting: Orthopedic Surgery

## 2020-02-18 ENCOUNTER — Ambulatory Visit (HOSPITAL_COMMUNITY): Payer: 59 | Admitting: Vascular Surgery

## 2020-02-18 ENCOUNTER — Ambulatory Visit (HOSPITAL_COMMUNITY)
Admission: RE | Admit: 2020-02-18 | Discharge: 2020-02-18 | Disposition: A | Discharge status: Home / Self Care | Payer: 59 | Attending: Orthopedic Surgery | Admitting: Orthopedic Surgery

## 2020-02-18 ENCOUNTER — Other Ambulatory Visit: Payer: Self-pay

## 2020-02-18 DIAGNOSIS — Z8249 Family history of ischemic heart disease and other diseases of the circulatory system: Secondary | ICD-10-CM | POA: Diagnosis not present

## 2020-02-18 DIAGNOSIS — Z79899 Other long term (current) drug therapy: Secondary | ICD-10-CM | POA: Insufficient documentation

## 2020-02-18 DIAGNOSIS — Z791 Long term (current) use of non-steroidal anti-inflammatories (NSAID): Secondary | ICD-10-CM | POA: Insufficient documentation

## 2020-02-18 DIAGNOSIS — Z888 Allergy status to other drugs, medicaments and biological substances status: Secondary | ICD-10-CM | POA: Diagnosis not present

## 2020-02-18 DIAGNOSIS — R569 Unspecified convulsions: Secondary | ICD-10-CM | POA: Diagnosis not present

## 2020-02-18 DIAGNOSIS — Z7951 Long term (current) use of inhaled steroids: Secondary | ICD-10-CM | POA: Insufficient documentation

## 2020-02-18 DIAGNOSIS — F17210 Nicotine dependence, cigarettes, uncomplicated: Secondary | ICD-10-CM | POA: Insufficient documentation

## 2020-02-18 DIAGNOSIS — I10 Essential (primary) hypertension: Secondary | ICD-10-CM | POA: Insufficient documentation

## 2020-02-18 DIAGNOSIS — Z419 Encounter for procedure for purposes other than remedying health state, unspecified: Secondary | ICD-10-CM

## 2020-02-18 DIAGNOSIS — J449 Chronic obstructive pulmonary disease, unspecified: Secondary | ICD-10-CM | POA: Diagnosis not present

## 2020-02-18 DIAGNOSIS — Z825 Family history of asthma and other chronic lower respiratory diseases: Secondary | ICD-10-CM | POA: Diagnosis not present

## 2020-02-18 DIAGNOSIS — M5412 Radiculopathy, cervical region: Secondary | ICD-10-CM | POA: Diagnosis not present

## 2020-02-18 HISTORY — PX: ANTERIOR CERVICAL DECOMP/DISCECTOMY FUSION: SHX1161

## 2020-02-18 SURGERY — ANTERIOR CERVICAL DECOMPRESSION/DISCECTOMY FUSION 1 LEVEL
Anesthesia: General | Site: Neck

## 2020-02-18 MED ORDER — LIDOCAINE 2% (20 MG/ML) 5 ML SYRINGE
INTRAMUSCULAR | Status: DC | PRN
  Administered 2020-02-18: 100 mg via INTRAVENOUS

## 2020-02-18 MED ORDER — OXYCODONE HCL 5 MG/5ML PO SOLN: 5.0000 mg | Freq: Once | ORAL | Status: DC | PRN

## 2020-02-18 MED ORDER — PHENYLEPHRINE 40 MCG/ML (10ML) SYRINGE FOR IV PUSH (FOR BLOOD PRESSURE SUPPORT)
PREFILLED_SYRINGE | INTRAVENOUS | Status: AC
  Filled 2020-02-18: qty 10

## 2020-02-18 MED ORDER — METHOCARBAMOL 500 MG PO TABS: 500.0000 mg | ORAL_TABLET | Freq: Three times a day (TID) | ORAL | 0 refills | Status: AC | PRN

## 2020-02-18 MED ORDER — ACETAMINOPHEN 160 MG/5ML PO SOLN: 325.0000 mg | ORAL | Status: DC | PRN

## 2020-02-18 MED ORDER — BUPIVACAINE-EPINEPHRINE 0.25% -1:200000 IJ SOLN
INTRAMUSCULAR | Status: DC | PRN
  Administered 2020-02-18: 8 mL

## 2020-02-18 MED ORDER — ONDANSETRON HCL 4 MG/2ML IJ SOLN
INTRAMUSCULAR | Status: AC
  Filled 2020-02-18: qty 2

## 2020-02-18 MED ORDER — DEXAMETHASONE SODIUM PHOSPHATE 10 MG/ML IJ SOLN
INTRAMUSCULAR | Status: DC | PRN
  Administered 2020-02-18: 10 mg via INTRAVENOUS

## 2020-02-18 MED ORDER — LIDOCAINE 2% (20 MG/ML) 5 ML SYRINGE
INTRAMUSCULAR | Status: AC
  Filled 2020-02-18: qty 5

## 2020-02-18 MED ORDER — HYDROMORPHONE HCL 1 MG/ML IJ SOLN
INTRAMUSCULAR | Status: AC
  Filled 2020-02-18: qty 0.5

## 2020-02-18 MED ORDER — FENTANYL CITRATE (PF) 250 MCG/5ML IJ SOLN
INTRAMUSCULAR | Status: DC | PRN
  Administered 2020-02-18 (×2): 50 ug via INTRAVENOUS
  Administered 2020-02-18: 100 ug via INTRAVENOUS
  Administered 2020-02-18: 50 ug via INTRAVENOUS

## 2020-02-18 MED ORDER — ROCURONIUM BROMIDE 10 MG/ML (PF) SYRINGE
PREFILLED_SYRINGE | INTRAVENOUS | Status: DC | PRN
  Administered 2020-02-18: 70 mg via INTRAVENOUS

## 2020-02-18 MED ORDER — LACTATED RINGERS IV SOLN: INTRAVENOUS | Status: DC

## 2020-02-18 MED ORDER — OXYCODONE HCL 5 MG PO TABS: 5.0000 mg | ORAL_TABLET | Freq: Once | ORAL | Status: DC | PRN

## 2020-02-18 MED ORDER — HEMOSTATIC AGENTS (NO CHARGE) OPTIME
TOPICAL | Status: DC | PRN
  Administered 2020-02-18: 1 via TOPICAL

## 2020-02-18 MED ORDER — BUPIVACAINE-EPINEPHRINE (PF) 0.25% -1:200000 IJ SOLN
INTRAMUSCULAR | Status: AC
  Filled 2020-02-18: qty 10

## 2020-02-18 MED ORDER — DEXAMETHASONE SODIUM PHOSPHATE 10 MG/ML IJ SOLN
INTRAMUSCULAR | Status: AC
  Filled 2020-02-18: qty 1

## 2020-02-18 MED ORDER — ORAL CARE MOUTH RINSE: 15.0000 mL | Freq: Once | OROMUCOSAL | Status: AC

## 2020-02-18 MED ORDER — MIDAZOLAM HCL 2 MG/2ML IJ SOLN
INTRAMUSCULAR | Status: DC | PRN
  Administered 2020-02-18: 2 mg via INTRAVENOUS

## 2020-02-18 MED ORDER — MEPERIDINE HCL 25 MG/ML IJ SOLN: 6.2500 mg | INTRAMUSCULAR | Status: DC | PRN

## 2020-02-18 MED ORDER — PROPOFOL 10 MG/ML IV BOLUS
INTRAVENOUS | Status: DC | PRN
  Administered 2020-02-18: 150 mg via INTRAVENOUS

## 2020-02-18 MED ORDER — CHLORHEXIDINE GLUCONATE 0.12 % MT SOLN
15.0000 mL | Freq: Once | OROMUCOSAL | Status: AC
  Administered 2020-02-18: 15 mL via OROMUCOSAL
  Filled 2020-02-18: qty 15

## 2020-02-18 MED ORDER — CEFAZOLIN SODIUM-DEXTROSE 2-4 GM/100ML-% IV SOLN
2.0000 g | INTRAVENOUS | Status: AC
  Administered 2020-02-18: 2 g via INTRAVENOUS
  Filled 2020-02-18: qty 100

## 2020-02-18 MED ORDER — SUGAMMADEX SODIUM 200 MG/2ML IV SOLN
INTRAVENOUS | Status: DC | PRN
  Administered 2020-02-18: 200 mg via INTRAVENOUS

## 2020-02-18 MED ORDER — HYDROMORPHONE HCL 1 MG/ML IJ SOLN
INTRAMUSCULAR | Status: DC | PRN
  Administered 2020-02-18 (×4): .25 mg via INTRAVENOUS

## 2020-02-18 MED ORDER — 0.9 % SODIUM CHLORIDE (POUR BTL) OPTIME
TOPICAL | Status: DC | PRN
  Administered 2020-02-18 (×2): 1000 mL

## 2020-02-18 MED ORDER — MIDAZOLAM HCL 2 MG/2ML IJ SOLN
INTRAMUSCULAR | Status: AC
  Filled 2020-02-18: qty 2

## 2020-02-18 MED ORDER — PROPOFOL 10 MG/ML IV BOLUS
INTRAVENOUS | Status: AC
  Filled 2020-02-18: qty 20

## 2020-02-18 MED ORDER — ACETAMINOPHEN 325 MG PO TABS: 325.0000 mg | ORAL_TABLET | ORAL | Status: DC | PRN

## 2020-02-18 MED ORDER — THROMBIN (RECOMBINANT) 20000 UNITS EX SOLR
CUTANEOUS | Status: AC
  Filled 2020-02-18: qty 20000

## 2020-02-18 MED ORDER — FENTANYL CITRATE (PF) 100 MCG/2ML IJ SOLN: 25.0000 ug | INTRAMUSCULAR | Status: DC | PRN

## 2020-02-18 MED ORDER — FENTANYL CITRATE (PF) 250 MCG/5ML IJ SOLN
INTRAMUSCULAR | Status: AC
  Filled 2020-02-18: qty 5

## 2020-02-18 MED ORDER — ROCURONIUM BROMIDE 10 MG/ML (PF) SYRINGE
PREFILLED_SYRINGE | INTRAVENOUS | Status: AC
  Filled 2020-02-18: qty 10

## 2020-02-18 MED ORDER — HYDROCODONE-ACETAMINOPHEN 10-325 MG PO TABS: 1.0000 | ORAL_TABLET | Freq: Four times a day (QID) | ORAL | 0 refills | Status: AC | PRN

## 2020-02-18 SURGICAL SUPPLY — 62 items
AGENT HMST KT MTR STRL THRMB (HEMOSTASIS) ×2
BONE VIVIGEN FORMABLE 1.3CC (Bone Implant) ×4 IMPLANT
CAGE CERV MOD 7X17X14 7D (Cage) ×3 IMPLANT
CANISTER SUCT 3000ML PPV (MISCELLANEOUS) ×4 IMPLANT
CLOSURE STERI-STRIP 1/2X4 (GAUZE/BANDAGES/DRESSINGS) ×1
CLSR STERI-STRIP ANTIMIC 1/2X4 (GAUZE/BANDAGES/DRESSINGS) ×3 IMPLANT
COVER MAYO STAND STRL (DRAPES) ×8 IMPLANT
COVER SURGICAL LIGHT HANDLE (MISCELLANEOUS) ×8 IMPLANT
COVER WAND RF STERILE (DRAPES) ×1 IMPLANT
DRAPE C-ARM 42X72 X-RAY (DRAPES) ×4 IMPLANT
DRAPE C-ARMOR (DRAPES) IMPLANT
DRAPE POUCH INSTRU U-SHP 10X18 (DRAPES) ×4 IMPLANT
DRAPE SURG 17X23 STRL (DRAPES) ×4 IMPLANT
DRAPE U-SHAPE 47X51 STRL (DRAPES) ×4 IMPLANT
DRSG OPSITE 4X5.5 SM (GAUZE/BANDAGES/DRESSINGS) ×3 IMPLANT
DRSG OPSITE POSTOP 3X4 (GAUZE/BANDAGES/DRESSINGS) ×4 IMPLANT
DURAPREP 26ML APPLICATOR (WOUND CARE) ×4 IMPLANT
ELECT COATED BLADE 2.86 ST (ELECTRODE) ×4 IMPLANT
ELECT PENCIL ROCKER SW 15FT (MISCELLANEOUS) ×4 IMPLANT
ELECT REM PT RETURN 9FT ADLT (ELECTROSURGICAL) ×4
ELECTRODE REM PT RTRN 9FT ADLT (ELECTROSURGICAL) ×2 IMPLANT
GLOVE BIO SURGEON STRL SZ 6.5 (GLOVE) ×3 IMPLANT
GLOVE BIO SURGEONS STRL SZ 6.5 (GLOVE) ×1
GLOVE BIOGEL PI IND STRL 6.5 (GLOVE) ×2 IMPLANT
GLOVE BIOGEL PI IND STRL 8.5 (GLOVE) ×2 IMPLANT
GLOVE BIOGEL PI INDICATOR 6.5 (GLOVE) ×2
GLOVE BIOGEL PI INDICATOR 8.5 (GLOVE) ×2
GLOVE SS BIOGEL STRL SZ 8.5 (GLOVE) ×2 IMPLANT
GLOVE SUPERSENSE BIOGEL SZ 8.5 (GLOVE) ×2
GOWN STRL REUS W/ TWL LRG LVL3 (GOWN DISPOSABLE) ×4 IMPLANT
GOWN STRL REUS W/TWL 2XL LVL3 (GOWN DISPOSABLE) ×4 IMPLANT
GOWN STRL REUS W/TWL LRG LVL3 (GOWN DISPOSABLE) ×8
GRAFT BNE MATRIX VG FRMBL SM 1 (Bone Implant) IMPLANT
KIT BASIN OR (CUSTOM PROCEDURE TRAY) ×4 IMPLANT
KIT TURNOVER KIT B (KITS) ×4 IMPLANT
NDL SPNL 18GX3.5 QUINCKE PK (NEEDLE) ×1 IMPLANT
NEEDLE SPNL 18GX3.5 QUINCKE PK (NEEDLE) ×4 IMPLANT
NS IRRIG 1000ML POUR BTL (IV SOLUTION) ×4 IMPLANT
NUT RETAINER F/PRODISC (ORTHOPEDIC DISPOSABLE SUPPLIES) ×6 IMPLANT
PACK ORTHO CERVICAL (CUSTOM PROCEDURE TRAY) ×4 IMPLANT
PACK UNIVERSAL I (CUSTOM PROCEDURE TRAY) ×4 IMPLANT
PAD ARMBOARD 7.5X6 YLW CONV (MISCELLANEOUS) ×8 IMPLANT
PIN RETAINER PRODISC 14 MM (PIN) ×6 IMPLANT
PLATE ACP 1.6 18 (Plate) ×3 IMPLANT
POSITIONER HEAD DONUT 9IN (MISCELLANEOUS) ×4 IMPLANT
RESTRAINT LIMB HOLDER UNIV (RESTRAINTS) ×4 IMPLANT
SCREW ACP VA SD 3.5X15 (Screw) ×12 IMPLANT
SPONGE INTESTINAL PEANUT (DISPOSABLE) ×3 IMPLANT
SPONGE SURGIFOAM ABS GEL SZ50 (HEMOSTASIS) ×4 IMPLANT
SURGIFLO W/THROMBIN 8M KIT (HEMOSTASIS) ×4 IMPLANT
SUT BONE WAX W31G (SUTURE) ×4 IMPLANT
SUT MNCRL AB 3-0 PS2 27 (SUTURE) ×4 IMPLANT
SUT SILK 3 0 TIES 17X18 (SUTURE)
SUT SILK 3-0 18XBRD TIE BLK (SUTURE) IMPLANT
SUT VIC AB 2-0 CT1 18 (SUTURE) ×4 IMPLANT
SYR BULB IRRIG 60ML STRL (SYRINGE) ×4 IMPLANT
SYR CONTROL 10ML LL (SYRINGE) IMPLANT
TAPE CLOTH 4X10 WHT NS (GAUZE/BANDAGES/DRESSINGS) ×4 IMPLANT
TAPE UMBILICAL COTTON 1/8X30 (MISCELLANEOUS) ×4 IMPLANT
TOWEL GREEN STERILE (TOWEL DISPOSABLE) ×4 IMPLANT
TOWEL GREEN STERILE FF (TOWEL DISPOSABLE) ×4 IMPLANT
WATER STERILE IRR 1000ML POUR (IV SOLUTION) ×4 IMPLANT

## 2020-02-18 NOTE — Op Note (Signed)
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  Postoperative diagnosis: Same  Operative procedure: Anterior cervical discectomy and fusion C5-6.  Complications: Patient was initially consented for a total disc replacement C5-6.  However, intraoperatively it was determined that the implant would not properly function/fit and so I elected to move forward with a fusion instead of the planned total disc arthroplasty.  Prior to surgery this possibility was discussed with the patient.  First Assistant: Marchelle Folks Ward  Implants: NuVasive titanium modulus C implant.  7 mm lordotic cage: 17 x 14.  NuVasive ACP plate: 18 mm length.  3.5 x 15 mm length locking screws.  Allograft: vivogen  Condition: Stable  EBL: Minimal  Indications: Andrew Villarreal is a very pleasant 59 year old gentleman who has had significant neck and radicular right arm pain.  Imaging studies demonstrated degenerative cervical disease with foraminal stenosis causing nerve root compression.  As result of the failure of conservative management we elected to move forward with a cervical disc arthroplasty.  Potential risk for inability to move forward with a due to technical reasons and the need to bailout to a fusion was discussed at length with the patient prior to surgery.  In addition all appropriate risks and benefits and alternatives were reviewed and consent was obtained.  Operative note: Patient was brought the operating room placed upon the operating room table.  After successful induction of general anesthesia endotracheal intubation teds SCDs were applied and the anterior cervical spine was prepped and draped in a standard fashion.  Timeout was taken to confirm patient procedure and all other important data.  Using fluoroscopy identified the C5-6 disc space level.  I marked out my incision site and infiltrated with quarter percent Marcaine with epinephrine.  Transverse incision  was made and sharp dissection was carried out down to the platysma.  Sharply dissected through the platysma and then transected it.  I continued to sharply dissect down the medial border of the sternocleidomastoid performing a standard Smith-Robinson approach to the cervical spine.  I was able to mobilize the esophagus and to the right and identify and protect the carotid sheath on the left side.  Hand-held retractor was placed to protect the esophagus and trachea and Kitner dissectors were used to mobilize the remaining prevertebral fascia and expose the anterior longitudinal ligament.  A needle was placed into the C5-6 disc space and an x-ray was taken confirming I was at the appropriate level.  Bipolar cautery was used to mobilize the longus coli muscles from the superior aspect of C5 to the inferior aspect of C6 bilaterally.  Self-retaining retractor blades were placed under the longus coli muscle the endotracheal cuff was deflated and expanded the retractor to the appropriate width.  The endotracheal cuff was then reinflated.  Annulotomy was performed with a 15 blade scalpel and using pituitary rongeurs and curettes I remove the bulk of the disc material.  The overhanging osteophyte from the inferior aspect of C5 with a 2 mm Kerrison rongeur.  Distraction pins were placed into the body of C5 and C6 in the midline.  I used fluoroscopy to confirm that I was centered with my guide pins.  I then distracted the intervertebral space spreader and maintained it with the distraction pins using curettes I remove the rest of the disc material until I was at the posterior aspect of the vertebral body.  I then used my fine nerve hook to begin dissecting through the posterior annulus and my 1 mm  Kerrison rongeur to resect the annulus and the osteophyte from the vertebral bodies.  I then identified the posterior longitudinal ligament and began gently dissecting through this with my nerve hook.  Once I developed a plane  under the PLL I resected the PLL with the 1 mm Kerrison rongeur.  I can now undercut the uncovertebral joints and adequately decompress the foramen.  At this point I could easily pass my nerve hook underneath the vertebral body of C5 and C6 and under the uncovertebral joint.  I confirmed this with fluoroscopy.  At this point I then obtained the trial total disc replacement implants.  I used the smallest 5 mm implant and malleted into place.  It was very secure.  None imaging studies he had direct contact with both implants and I could not freely move the trial.  At this point I felt as though I was overstuffing the compartment with the implant and it would not function correctly.  In addition there remained a slight amount of retrolisthesis at that level.  Collectively for these reasons I elected not to move forward with the total disc arthroplasty.  I felt as though if we placed it the patient would not function well and his quality of life would not have improved.  At this point I obtained the trial intervertebral spacers and elected to use the NuVasive size 7 large implant.  The trials provided satisfactory overall placement and restoration of disc space height.  The implant was packed with the allograft and malleted to position.  I had excellent position with reduction of the retrolisthesis and excellent restoration of intervertebral vertebral height and indirect foraminal decompression.  The distraction pins were then removed and the resulting bone hole was sealed with bone wax.  The anterior cervical plate was contoured and secured to the vertebral bodies with 15 mm locking screws.  All 4 screws had excellent purchase.  The locking device was then activated to prevent backout of the screws according manufacture standards.  At this point I copiously irrigated the wound with normal saline.  Bleeding bone edges were sealed with bone wax.  Using bipolar cautery and FloSeal I obtained and maintain hemostasis.   Once all the retractors were removed I copiously irrigated one last time and returned the trachea and esophagus to midline.  Final x-rays demonstrated satisfactory overall position of the cage in the anterior cervical plate.  I then closed the platysma with interrupted 2-0 Vicryl suture and the skin with 3-0 Monocryl.  Steri-Strips and a dry dressing were applied and the patient was ultimately extubated transfer the PACU without incident.  At the end of the case all needle sponge counts were correct.

## 2020-02-18 NOTE — Anesthesia Procedure Notes (Signed)
Procedure Name: Intubation Date/Time: 02/18/2020 7:40 AM Performed by: Michele Rockers, CRNA Pre-anesthesia Checklist: Patient identified, Emergency Drugs available, Suction available and Patient being monitored Patient Re-evaluated:Patient Re-evaluated prior to induction Oxygen Delivery Method: Circle system utilized Preoxygenation: Pre-oxygenation with 100% oxygen Induction Type: IV induction Ventilation: Mask ventilation without difficulty Laryngoscope Size: Glidescope, Mac and 4 Grade View: Grade I Tube type: Oral Tube size: 8.0 mm Number of attempts: 1 Airway Equipment and Method: Stylet and Oral airway Placement Confirmation: ETT inserted through vocal cords under direct vision,  positive ETCO2 and breath sounds checked- equal and bilateral Secured at: 22 cm Tube secured with: Tape Dental Injury: Teeth and Oropharynx as per pre-operative assessment

## 2020-02-18 NOTE — Discharge Instructions (Signed)

## 2020-02-18 NOTE — Transfer of Care (Signed)
Immediate Anesthesia Transfer of Care Note  Patient: Andrew Villarreal  Procedure(s) Performed: ANTERIOR CERVICAL DECOMPRESSION/DISCECTOMY FUSION FIVE THROUGH SIX (Neck)  Patient Location: PACU  Anesthesia Type:General  Level of Consciousness: drowsy, patient cooperative and responds to stimulation  Airway & Oxygen Therapy: Patient Spontanous Breathing and Patient connected to face mask oxygen  Post-op Assessment: Report given to RN, Post -op Vital signs reviewed and stable and Patient moving all extremities X 4  Post vital signs: Reviewed and stable  Last Vitals:  Vitals Value Taken Time  BP 152/91 02/18/20 1048  Temp 36.6 C 02/18/20 1045  Pulse 70 02/18/20 1049  Resp 18 02/18/20 1049  SpO2 96 % 02/18/20 1049  Vitals shown include unvalidated device data.  Last Pain:  Vitals:   02/18/20 0614  TempSrc:   PainSc: 5       Patients Stated Pain Goal: 5 (02/18/20 6237)  Complications: No complications documented.

## 2020-02-18 NOTE — H&P (Signed)
Addendum H&P.  Patient continues to have significant neck and radicular right arm pain.  Despite appropriate conservative management consisting of injection therapy, physical therapy, activity modification, and medications his overall quality of life continues to deteriorate.  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  Patient has a positive Spurling sign, positive numbness and dysesthesias.  This point time I have reviewed the surgical plan with the patient and he is expressed an understanding of the risks, benefits, and alternatives to surgery.  He is expressed a desire to move forward with surgery.  All of his questions were encouraged and addressed.  There has been no change in his clinical exam since his last office visit of 02/09/2020.

## 2020-02-18 NOTE — Progress Notes (Signed)
Orthopedic Tech Progress Note Patient Details:  Andrew Villarreal 06-25-1961 [id number] OR RN called requesting for a PHILLY COLLAR for patient Ortho Devices Type of Ortho Device: [place] cervical collar Ortho Device/Splint Location: NECK Ortho Device/Splint Interventions: Other (comment)   Post Interventions Patient Tolerated: Other (comment) Instructions Provided: Other (comment)   Donald Pore 02/18/2020, 10:02 AM

## 2020-02-18 NOTE — Brief Op Note (Signed)
02/18/2020  10:36 AM  PATIENT:  Andrew Villarreal  59 y.o. male  PRE-OPERATIVE DIAGNOSIS:  Cervical radiculopathy  POST-OPERATIVE DIAGNOSIS:  Cervical radiculopathy  PROCEDURE:  Procedure(s): ANTERIOR CERVICAL DECOMPRESSION/DISCECTOMY FUSION FIVE THROUGH SIX  SURGEON:  Surgeon(s) and Role:    Venita Lick, MD - Primary  PHYSICIAN ASSISTANT:   ASSISTANTS: Amanda Ward, PA   ANESTHESIA:   general  EBL:  minimal   BLOOD ADMINISTERED:none  DRAINS: none   LOCAL MEDICATIONS USED:  MARCAINE     SPECIMEN:  No Specimen  DISPOSITION OF SPECIMEN:  N/A  COUNTS:  YES  TOURNIQUET:  * No tourniquets in log *  DICTATION: .Dragon Dictation  PLAN OF CARE: Discharge to home after PACU  PATIENT DISPOSITION:  PACU - hemodynamically stable.

## 2020-02-19 NOTE — Anesthesia Postprocedure Evaluation (Signed)
Anesthesia Post Note  Patient: Andrew Villarreal  Procedure(s) Performed: ANTERIOR CERVICAL DECOMPRESSION/DISCECTOMY FUSION FIVE THROUGH SIX (Neck)     Patient location during evaluation: PACU Anesthesia Type: General Level of consciousness: awake and alert Pain management: pain level controlled Vital Signs Assessment: post-procedure vital signs reviewed and stable Respiratory status: spontaneous breathing, nonlabored ventilation, respiratory function stable and patient connected to nasal cannula oxygen Cardiovascular status: blood pressure returned to baseline and stable Postop Assessment: no apparent nausea or vomiting Anesthetic complications: no   No complications documented.  Last Vitals:  Vitals:   02/18/20 1130 02/18/20 1145  BP: (!) 142/90 (!) 144/87  Pulse: 66 68  Resp: 14 15  Temp:    SpO2: 95% 97%    Last Pain:  Vitals:   02/18/20 1152  TempSrc:   PainSc: 0-No pain   Pain Goal: Patients Stated Pain Goal: 5 (02/18/20 3343)                 [name]

## 2020-02-20 ENCOUNTER — Encounter (HOSPITAL_COMMUNITY): Payer: Self-pay | Admitting: Orthopedic Surgery

## 2020-03-12 ENCOUNTER — Telehealth: Payer: Self-pay | Admitting: Internal Medicine

## 2020-03-12 ENCOUNTER — Other Ambulatory Visit: Payer: Self-pay

## 2020-03-12 MED ORDER — AMLODIPINE BESY-BENAZEPRIL HCL 10-40 MG PO CAPS: 1.0000 | ORAL_CAPSULE | Freq: Every day | ORAL | 9 refills | Status: DC

## 2020-03-12 NOTE — Telephone Encounter (Signed)
Patient called requesting Rx on amLODipine-benazepril (LOTREL) 10-40 MG capsule to be called in at [facility].

## 2020-03-12 NOTE — Telephone Encounter (Signed)
Rx was sent to [facility] on [address]

## 2020-09-27 IMAGING — DX DG CHEST 2V
2 series · 2 of 2 positions shown · non-contrast
Comparison: November 15, 2016

CLINICAL DATA: [id number] positive. Chest pain and shortness of
breath

EXAM:
CHEST - 2 VIEW

[w chest pa]
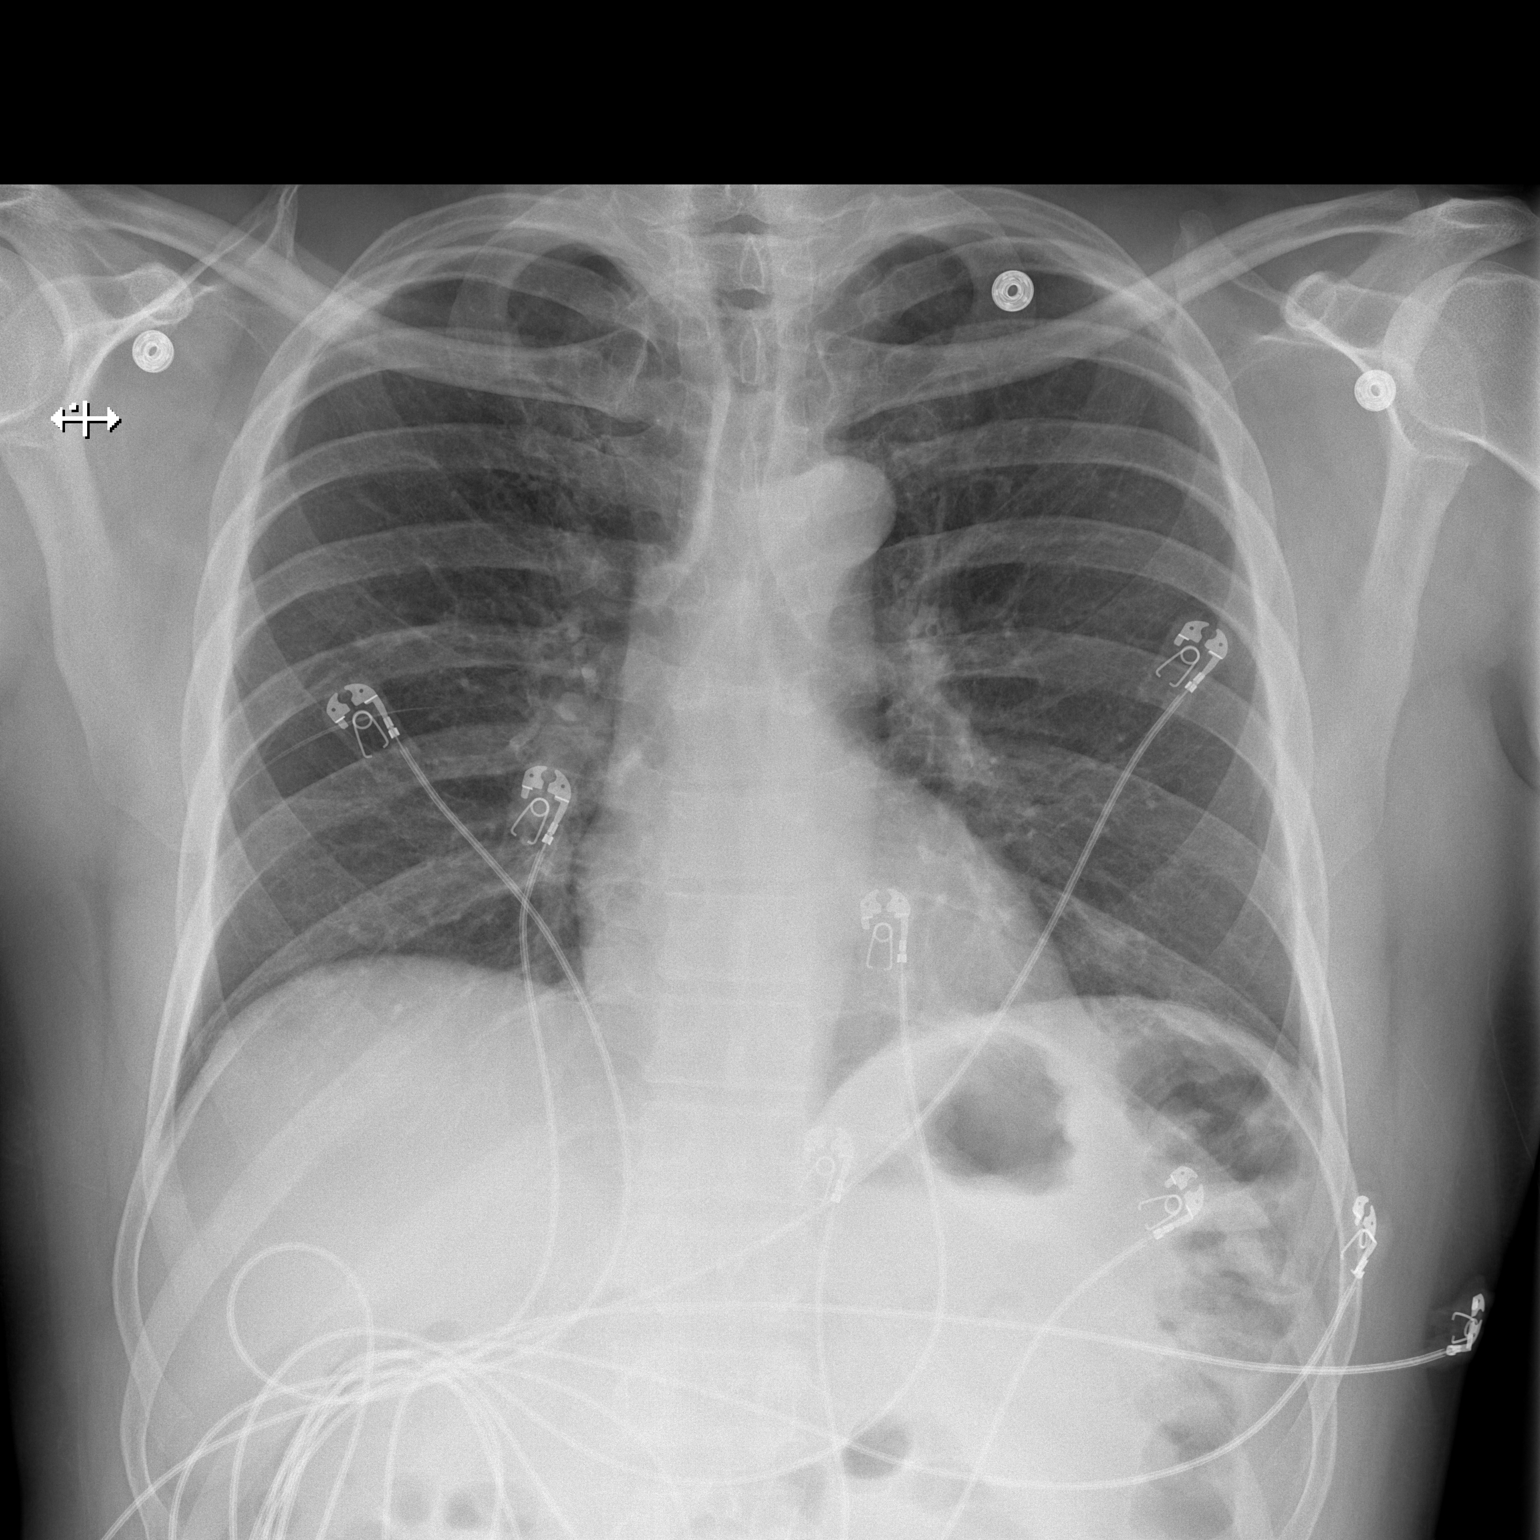

[w chest lat]
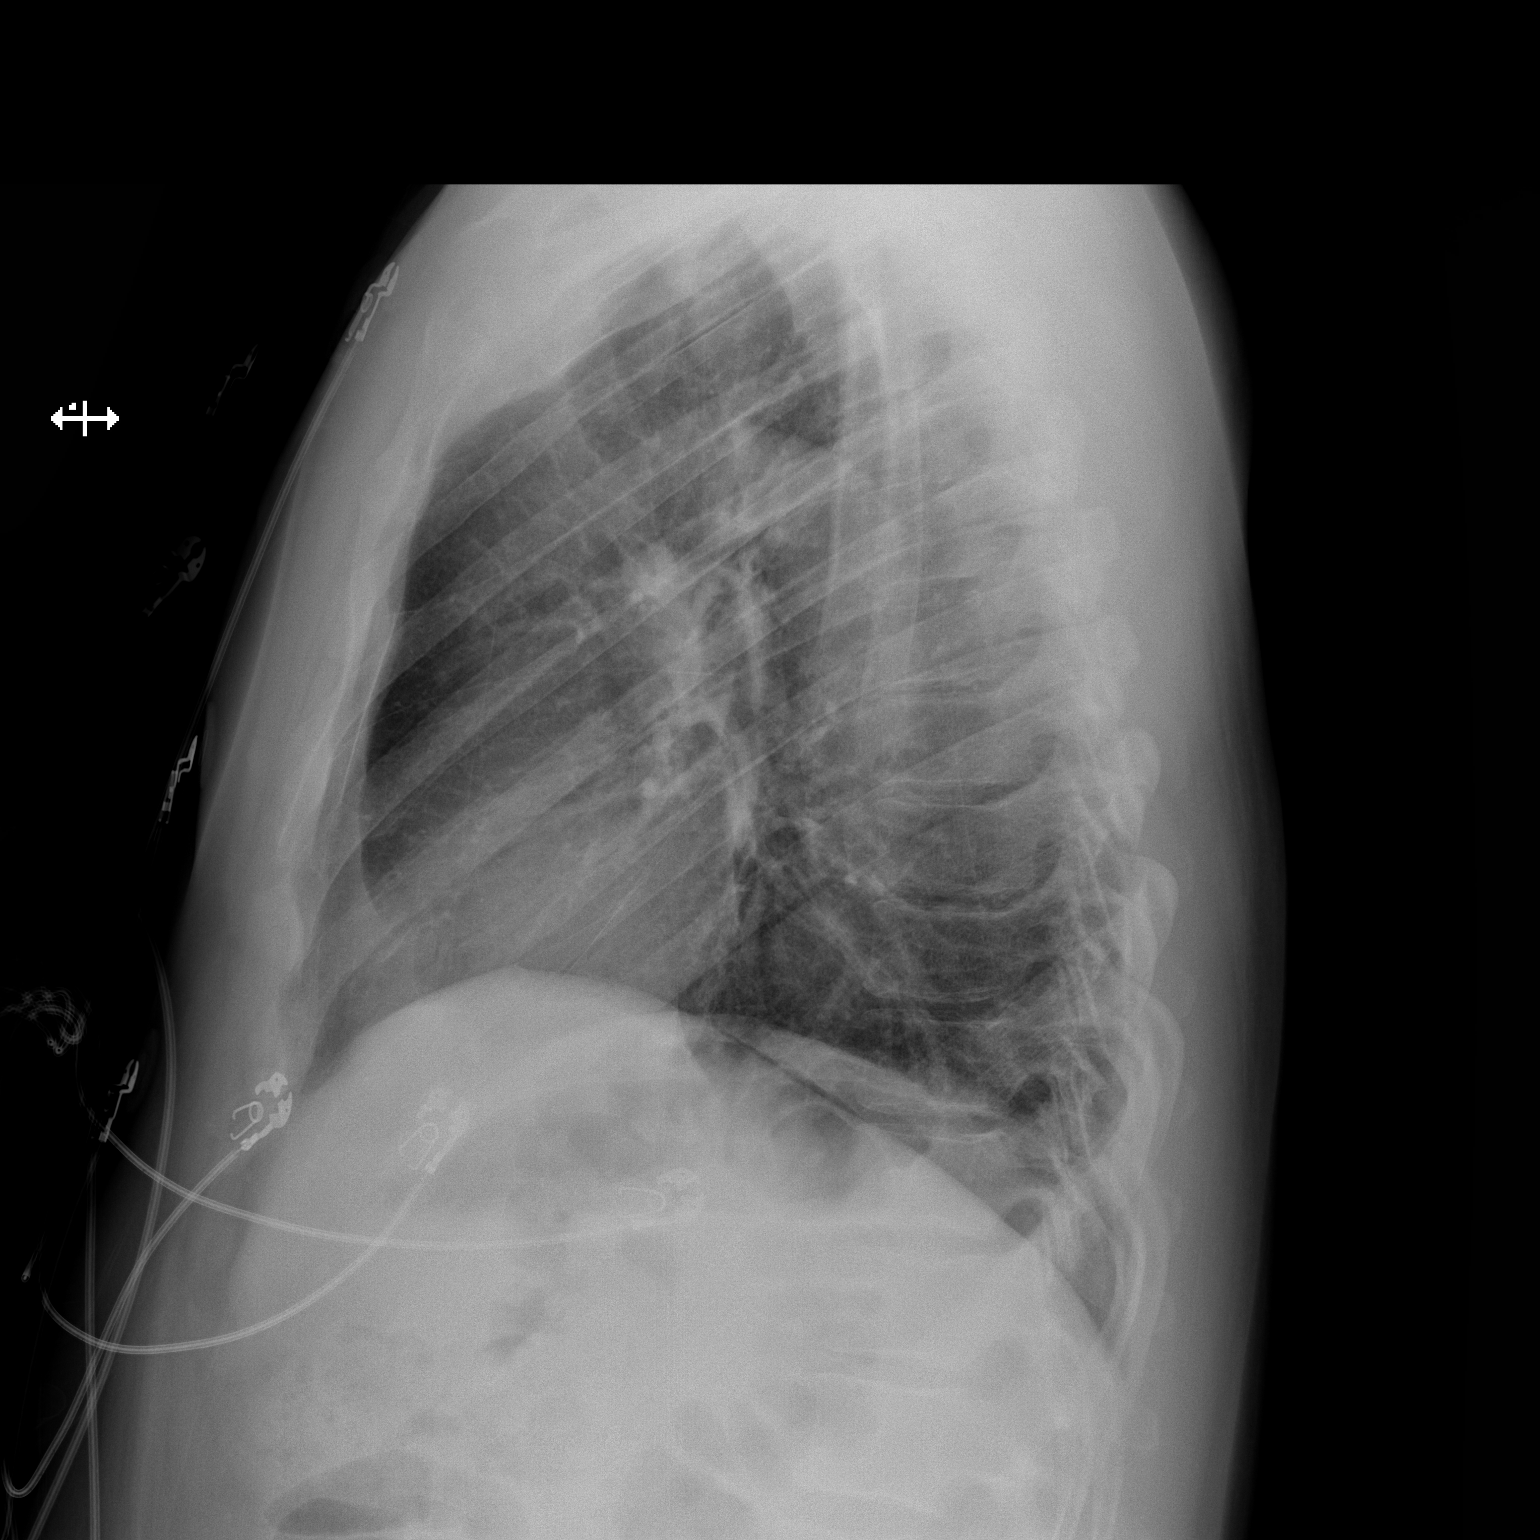

[2 of 2 positions shown; findings below may reference images not displayed]

FINDINGS: The lungs are clear. The heart size and pulmonary vascularity are
normal. No adenopathy. No bone lesions.
IMPRESSION: Lungs clear.  No evident adenopathy.

## 2020-11-17 ENCOUNTER — Other Ambulatory Visit: Payer: Self-pay

## 2020-11-17 ENCOUNTER — Ambulatory Visit (HOSPITAL_COMMUNITY)
Admission: EM | Admit: 2020-11-17 | Discharge: 2020-11-17 | Disposition: A | Discharge status: Home / Self Care | Payer: No Typology Code available for payment source

## 2021-01-29 ENCOUNTER — Other Ambulatory Visit: Payer: Self-pay | Admitting: Internal Medicine

## 2021-01-31 NOTE — Telephone Encounter (Signed)
Pt did not answer. Voicemail was left notifying that follow-up appt is needed for refills. Instructed to call [facility] to schedule appointment.

## 2021-01-31 NOTE — Telephone Encounter (Signed)
Needs to make a follow up appt for more refills

## 2021-02-07 IMAGING — CR DG CHEST 2V
2 series · 2 of 2 positions shown · non-contrast
Comparison: CT 09/16/2019.

CLINICAL DATA: COPD.  History of pneumonia.

EXAM:
CHEST - 2 VIEW

[w chest pa]
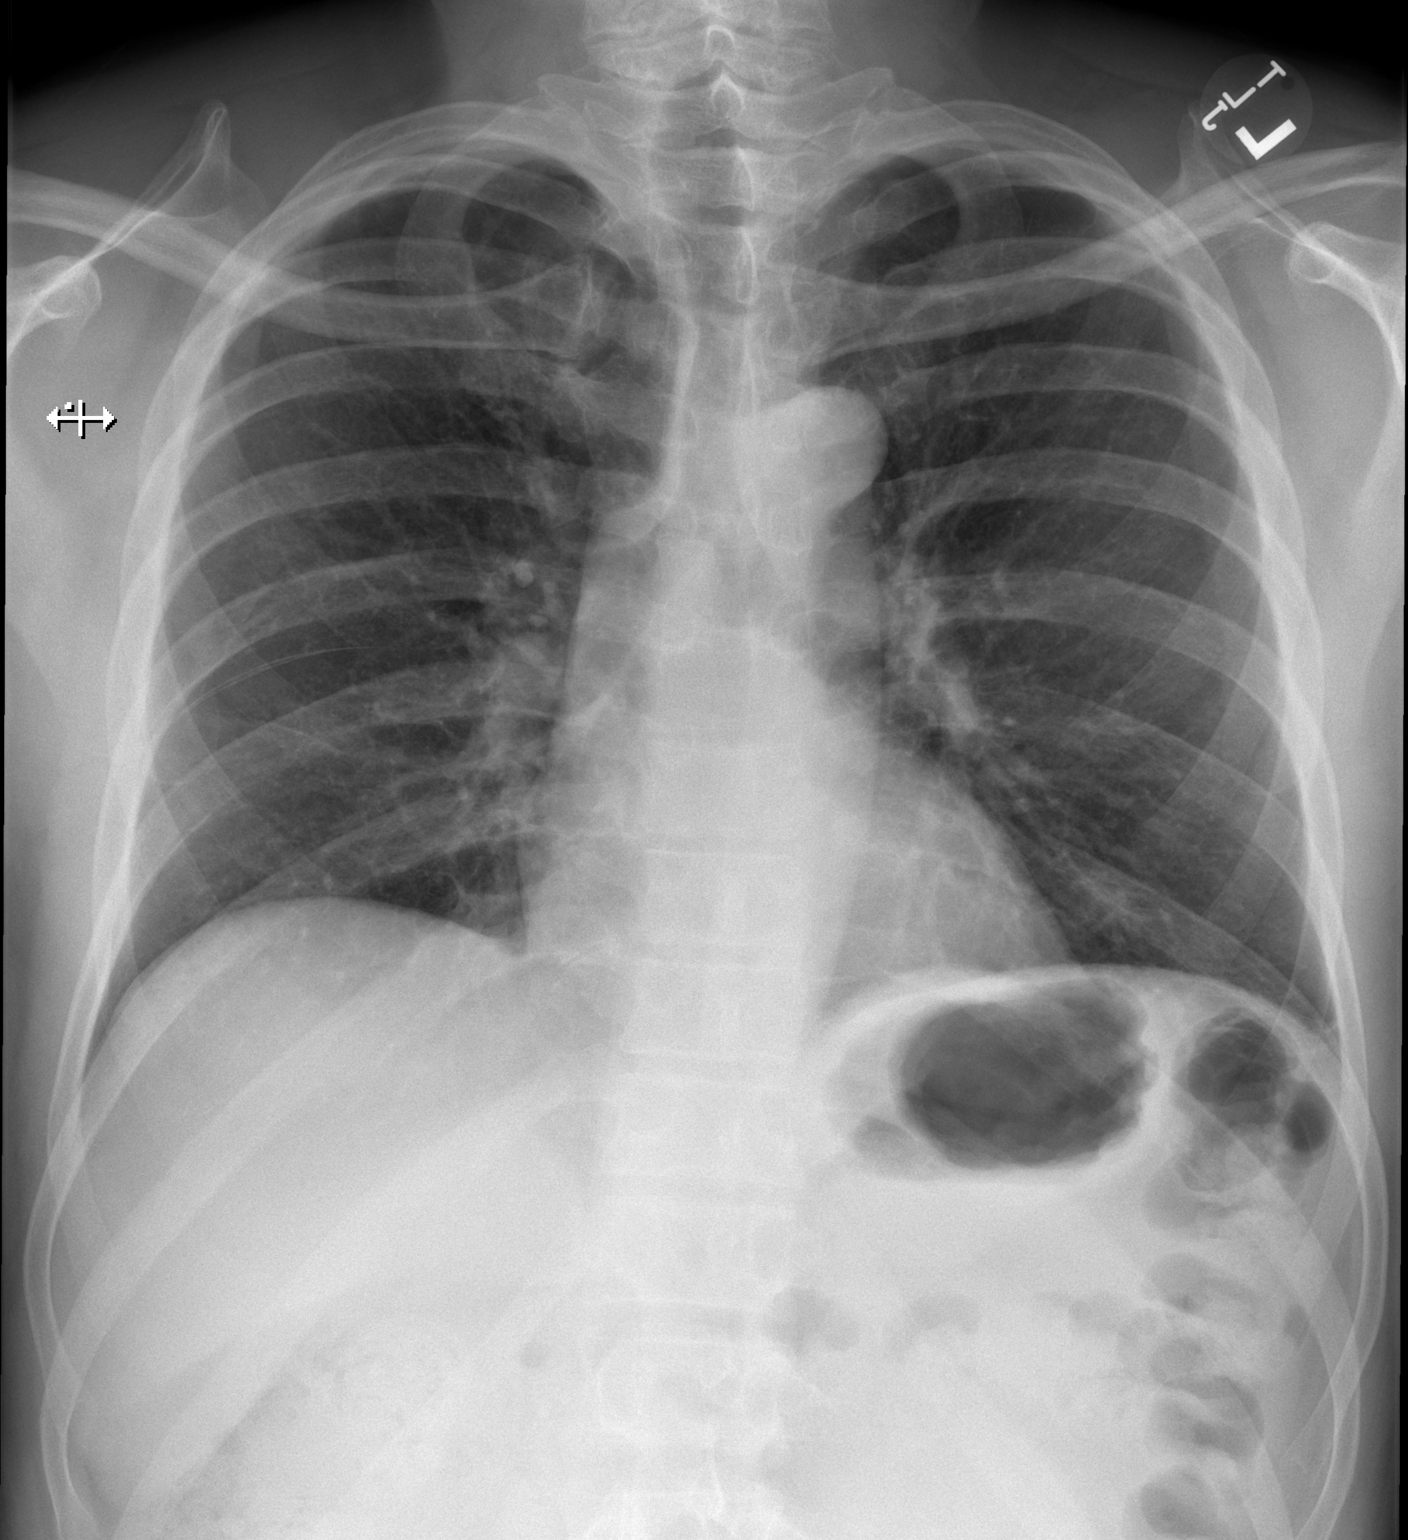

[w chest lat]
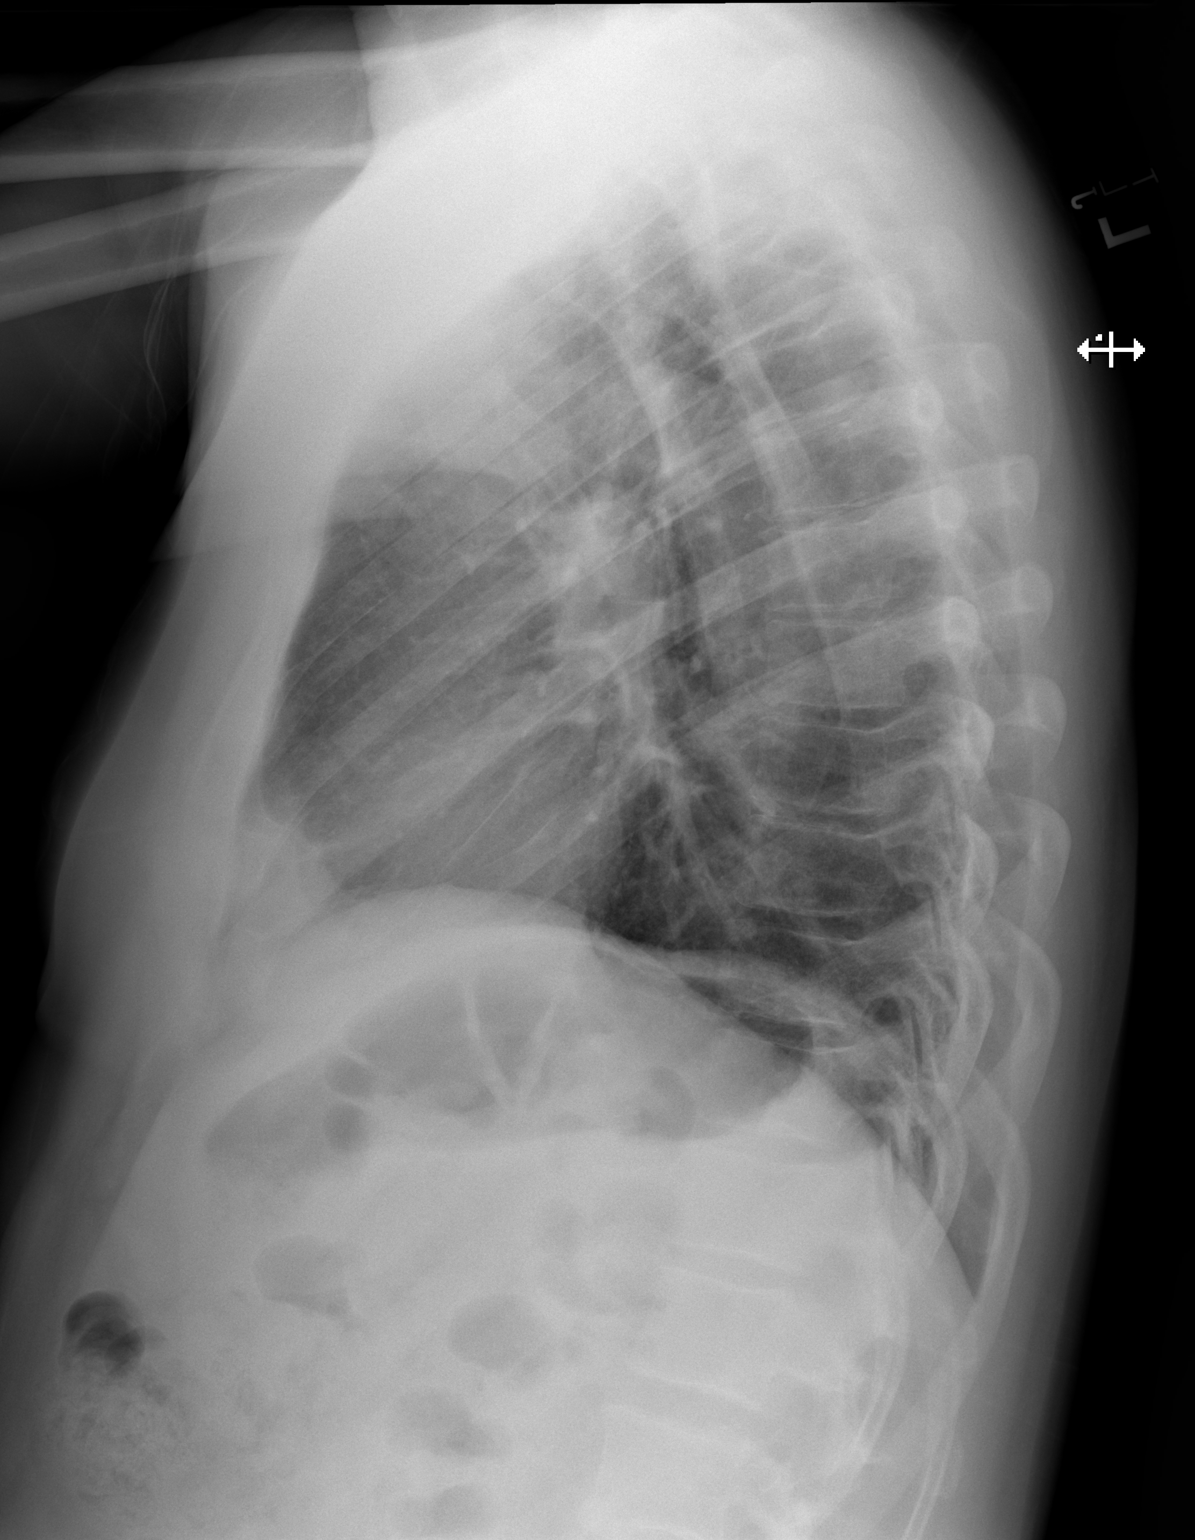

[2 of 2 positions shown; findings below may reference images not displayed]

FINDINGS: Mediastinum and hilar structures normal. Low lung volumes. Mild
right base subsegmental atelectasis and or scarring again noted. No
pleural effusion or pneumothorax. No acute bony abnormality.
IMPRESSION: Low lung volumes. Mild right base subsegmental atelectasis and or
scarring again noted. No acute cardiopulmonary disease.

## 2021-05-24 DIAGNOSIS — I10 Essential (primary) hypertension: Secondary | ICD-10-CM | POA: Diagnosis not present

## 2021-05-24 DIAGNOSIS — Z0000 Encounter for general adult medical examination without abnormal findings: Secondary | ICD-10-CM | POA: Diagnosis not present

## 2021-05-24 DIAGNOSIS — F17200 Nicotine dependence, unspecified, uncomplicated: Secondary | ICD-10-CM | POA: Diagnosis not present

## 2021-05-24 DIAGNOSIS — M5030 Other cervical disc degeneration, unspecified cervical region: Secondary | ICD-10-CM | POA: Diagnosis not present

## 2022-05-26 ENCOUNTER — Other Ambulatory Visit: Payer: Self-pay | Admitting: Family Medicine

## 2022-05-26 DIAGNOSIS — F17200 Nicotine dependence, unspecified, uncomplicated: Secondary | ICD-10-CM

## 2022-06-05 ENCOUNTER — Ambulatory Visit
Admission: RE | Admit: 2022-06-05 | Discharge: 2022-06-05 | Disposition: A | Discharge status: Home / Self Care | Payer: Commercial Managed Care - HMO | Source: Ambulatory Visit | Attending: Family Medicine | Admitting: Family Medicine

## 2022-06-05 DIAGNOSIS — F17200 Nicotine dependence, unspecified, uncomplicated: Secondary | ICD-10-CM

## 2022-06-09 ENCOUNTER — Other Ambulatory Visit (HOSPITAL_COMMUNITY): Payer: Self-pay | Admitting: Family Medicine

## 2022-06-09 DIAGNOSIS — I700 Atherosclerosis of aorta: Secondary | ICD-10-CM

## 2022-06-09 DIAGNOSIS — F17200 Nicotine dependence, unspecified, uncomplicated: Secondary | ICD-10-CM

## 2022-06-23 DIAGNOSIS — Z419 Encounter for procedure for purposes other than remedying health state, unspecified: Secondary | ICD-10-CM | POA: Diagnosis not present

## 2022-06-27 ENCOUNTER — Ambulatory Visit (HOSPITAL_COMMUNITY)
Admission: RE | Admit: 2022-06-27 | Discharge: 2022-06-27 | Disposition: A | Discharge status: Home / Self Care | Payer: No Typology Code available for payment source | Source: Ambulatory Visit | Attending: Family Medicine | Admitting: Family Medicine

## 2022-06-27 DIAGNOSIS — F17200 Nicotine dependence, unspecified, uncomplicated: Secondary | ICD-10-CM | POA: Insufficient documentation

## 2022-06-27 DIAGNOSIS — I700 Atherosclerosis of aorta: Secondary | ICD-10-CM | POA: Insufficient documentation

## 2022-07-03 NOTE — Progress Notes (Signed)
Andrew Hughs, DO Reason for referral-coronary artery disease  HPI: 61 year old male for evaluation of coronary artery disease at request of Andrew Reichmann, DO.  Patient had a normal stress echocardiogram in 2010.  Calcium score December 2023 559 which was 97 percentile; aortic atherosclerosis noted.  Cardiology asked to evaluate.  Patient notes dyspnea with more vigorous activities but not routine activities.  No orthopnea, PND, pedal edema, chest pain or syncope.  No claudication.  Current Outpatient Medications  Medication Sig Dispense Refill   albuterol (VENTOLIN HFA) 108 (90 Base) MCG/ACT inhaler Inhale 2 puffs into the lungs every 6 (six) hours as needed for wheezing or shortness of breath. 18 g 6   amLODipine-benazepril (LOTREL) 10-40 MG capsule Take 1 capsule by mouth once daily 30 capsule 0   atorvastatin (LIPITOR) 20 MG tablet Take 1 tablet by mouth daily.     Fluticasone-Salmeterol (ADVAIR DISKUS) 100-50 MCG/DOSE AEPB 1 inhalation twice daily then brush teeth and tongue (Patient taking differently: Inhale 1 puff into the lungs 2 (two) times daily as needed (shortness of breath). brush teeth and tongue after use) 60 each 11   umeclidinium bromide (INCRUSE ELLIPTA) 62.5 MCG/INH AEPB Inhale 1 puff into the lungs daily. 30 each 11   No current facility-administered medications for this visit.    Allergies  Allergen Reactions   Carbamazepine Nausea Only    Other Reaction(s): Dizziness, Nausea, Stupor   Clonazepam Hives and Swelling   Onion     Other Reaction(s): Seizure   Zofran [Ondansetron Hcl] Hives and Swelling    Broke out in hives immediately after injection.      Past Medical History:  Diagnosis Date   Asthma    COPD (chronic obstructive pulmonary disease) (HCC)    HTN (hypertension)    Hyperlipidemia    Migraine    Pneumonia 2020   Right temporal lobe mass    cyst   Seizures (HCC)    No seizure since 2017.  Right temporal lobe mass--stable for many  years    Past Surgical History:  Procedure Laterality Date   ANTERIOR CERVICAL DECOMP/DISCECTOMY FUSION  02/18/2020   Procedure: ANTERIOR CERVICAL DECOMPRESSION/DISCECTOMY FUSION FIVE THROUGH SIX;  Surgeon: Venita Lick, MD;  Location: [facility] OR;  Service: Orthopedics;;   CARDIOVASCULAR STRESS TEST     normal    Social History   Socioeconomic History   Marital status: Married    Spouse name: Not on file   Number of children: 4   Years of education: Not on file   Highest education level: Not on file  Occupational History   Occupation: Runner, broadcasting/film/video    Comment: [employer]  Tobacco Use   Smoking status: Former    Packs/day: 1.00    Years: 38.00    Total pack years: 38.00    Types: Cigarettes    Quit date: 01/22/2020    Years since quitting: 2.4   Smokeless tobacco: Never  Vaping Use   Vaping Use: Never used  Substance and Sexual Activity   Alcohol use: Yes    Comment: Rare   Drug use: Yes    Types: Marijuana    Comment: x3 weekly   Sexual activity: Yes    Birth control/protection: None    Comment: Married  Other Topics Concern   Not on file  Social History Narrative   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   Lives alone.   Social Determinants  of Health   Financial Resource Strain: Not on file  Food Insecurity: Not on file  Transportation Needs: Not on file  Physical Activity: Not on file  Stress: Not on file  Social Connections: Not on file  Intimate Partner Violence: Not on file    Family History  Problem Relation Age of Onset   Heart attack Father    Heart disease Father    Cancer Father        metastatic to bone.   Bipolar disorder Mother        Hospitalized frequently   Hypertension Sister    COPD Brother        history of smoking    ROS: no fevers or chills, productive cough, hemoptysis, dysphasia, odynophagia, melena, hematochezia, dysuria, hematuria, rash, seizure activity,  orthopnea, PND, pedal edema, claudication. Remaining systems are negative.  Physical Exam:   Blood pressure 105/71, pulse 61, height 5\' 10"  (1.778 m), weight 191 lb 9.6 oz (86.9 kg), SpO2 98 %.  General:  Well developed/well nourished in NAD Skin warm/dry Patient not depressed No peripheral clubbing Back-normal HEENT-normal/normal eyelids Neck supple/normal carotid upstroke bilaterally; no bruits; no JVD; no thyromegaly chest - CTA/ normal expansion CV - RRR/normal S1 and S2; no murmurs, rubs or gallops;  PMI nondisplaced Abdomen -NT/ND, no HSM, no mass, + bowel sounds, no bruit 2+ femoral pulses, no bruits Ext-no edema, chords, 2+ DP Neuro-grossly nonfocal  ECG -normal sinus rhythm with first-degree AV block, no ST changes.  Personally reviewed  A/P  1 coronary artery disease/coronary calcification-significantly elevated calcium score.  Will add aspirin 81 mg daily.  Continue statin.  Schedule stress nuclear study for risk stratification.  We discussed lifestyle modification including diet and exercise.  2 hypertension-blood pressure is controlled.  Continue present medical regimen.  3 hyperlipidemia-given documented coronary disease I will increase his Lipitor from 20 to 80 mg daily.  Check lipids and liver in 8 weeks.  Goal LDL 55.  4 tobacco abuse-patient counseled on discontinuing.  , MD

## 2022-07-10 ENCOUNTER — Ambulatory Visit: Payer: Commercial Managed Care - HMO | Attending: Cardiology | Admitting: Cardiology

## 2022-07-10 ENCOUNTER — Encounter: Payer: Self-pay | Admitting: Cardiology

## 2022-07-10 VITALS — BP 105/71 | HR 61 | Ht 70.0 in | Wt 191.6 lb

## 2022-07-10 DIAGNOSIS — E7800 Pure hypercholesterolemia, unspecified: Secondary | ICD-10-CM

## 2022-07-10 DIAGNOSIS — I2510 Atherosclerotic heart disease of native coronary artery without angina pectoris: Secondary | ICD-10-CM

## 2022-07-10 DIAGNOSIS — I10 Essential (primary) hypertension: Secondary | ICD-10-CM

## 2022-07-10 MED ORDER — ASPIRIN 81 MG PO TBEC: 81.0000 mg | DELAYED_RELEASE_TABLET | Freq: Every day | ORAL | 3 refills | Status: AC

## 2022-07-10 MED ORDER — ATORVASTATIN CALCIUM 80 MG PO TABS: 80.0000 mg | ORAL_TABLET | Freq: Every day | ORAL | 3 refills | Status: DC

## 2022-07-10 NOTE — Patient Instructions (Signed)
Medication Instructions:   START ASPIRIN 81 MG ONCE DAILY  INCREASE ATORVASTATIN TO 80 MG ONCE DAILY=4 OF THE 20 MG TABLETS ONCE DAILY  *If you need a refill on your cardiac medications before your next appointment, please call your pharmacy*   Lab Work:  Your physician recommends that you return for lab work in: [facility]  If you have labs (blood work) drawn today and your tests are completely normal, you will receive your results only by: MyChart Message (if you have MyChart) OR A paper copy in the mail If you have any lab test that is abnormal or we need to change your treatment, we will call you to review the results.   Testing/Procedures:  Your physician has requested that you have en exercise stress myoview. For further information please visit [email or web address] Please follow instruction sheet, as given. [address]   Follow-Up: At [facility], you and your health needs are our priority.  As part of our continuing mission to provide you with exceptional heart care, we have created designated Provider Care Teams.  These Care Teams include your primary Cardiologist (physician) and Advanced Practice Providers (APPs -  Physician Assistants and Nurse Practitioners) who all work together to provide you with the care you need, when you need it.  We recommend signing up for the patient portal called "MyChart".  Sign up information is provided on this After Visit Summary.  MyChart is used to connect with patients for Virtual Visits (Telemedicine).  Patients are able to view lab/test results, encounter notes, upcoming appointments, etc.  Non-urgent messages can be sent to your provider as well.   To learn more about what you can do with MyChart, go to [email or web address].    Your next appointment:   12 month(s)  The format for your next appointment:   In Person  Provider:   Olga Millers MD

## 2022-07-13 ENCOUNTER — Telehealth: Payer: Self-pay

## 2022-07-13 NOTE — Telephone Encounter (Signed)
Detailed instructions left on the patient's answering machine. Asked to call back with any questions. S.Rumi Taras EMTP 

## 2022-07-18 ENCOUNTER — Ambulatory Visit (HOSPITAL_COMMUNITY): Payer: Commercial Managed Care - HMO | Attending: Cardiology

## 2022-07-18 DIAGNOSIS — I2510 Atherosclerotic heart disease of native coronary artery without angina pectoris: Secondary | ICD-10-CM | POA: Insufficient documentation

## 2022-07-18 LAB — MYOCARDIAL PERFUSION IMAGING
Estimated workload: 7.8
Exercise duration (min): 6 min
Exercise duration (sec): 33 s
LV dias vol: 55 mL (ref 62–150)
LV sys vol: 19 mL
MPHR: 160 {beats}/min
Nuc Stress EF: 66 %
Peak HR: 148 {beats}/min
Percent HR: 92 %
Rest HR: 65 {beats}/min
Rest Nuclear Isotope Dose: 10.6 mCi
SDS: 0
SRS: 0
SSS: 0
ST Depression (mm): 0 mm
Stress Nuclear Isotope Dose: 32.1 mCi
TID: 0.87

## 2022-07-18 MED ORDER — TECHNETIUM TC 99M TETROFOSMIN IV KIT
10.6000 | PACK | Freq: Once | INTRAVENOUS | Status: AC | PRN
  Administered 2022-07-18: 10.6 via INTRAVENOUS

## 2022-07-18 MED ORDER — TECHNETIUM TC 99M TETROFOSMIN IV KIT
32.1000 | PACK | Freq: Once | INTRAVENOUS | Status: AC | PRN
  Administered 2022-07-18: 32.1 via INTRAVENOUS

## 2022-07-24 DIAGNOSIS — Z419 Encounter for procedure for purposes other than remedying health state, unspecified: Secondary | ICD-10-CM | POA: Diagnosis not present

## 2022-07-25 ENCOUNTER — Encounter: Payer: Self-pay | Admitting: *Deleted

## 2022-08-16 ENCOUNTER — Telehealth: Payer: Self-pay

## 2022-08-16 NOTE — Telephone Encounter (Signed)
Mychart msg sent. AS, CMA 

## 2022-09-10 DIAGNOSIS — R82998 Other abnormal findings in urine: Secondary | ICD-10-CM | POA: Diagnosis not present

## 2022-09-27 ENCOUNTER — Encounter: Payer: Self-pay | Admitting: *Deleted

## 2022-10-27 LAB — LIPID PANEL
Chol/HDL Ratio: 4.2 ratio (ref 0.0–5.0)
Cholesterol, Total: 158 mg/dL (ref 100–199)
HDL: 38 mg/dL — ABNORMAL LOW (ref >39)
LDL Chol Calc (NIH): 90 mg/dL (ref 0–99)
Triglycerides: 173 mg/dL — ABNORMAL HIGH (ref 0–149)
VLDL Cholesterol Cal: 30 mg/dL (ref 5–40)

## 2022-10-27 LAB — HEPATIC FUNCTION PANEL
ALT: 28 IU/L (ref 0–44)
AST: 17 IU/L (ref 0–40)
Albumin: 4.7 g/dL (ref 3.9–4.9)
Alkaline Phosphatase: 94 IU/L (ref 44–121)
Bilirubin Total: 0.3 mg/dL (ref 0.0–1.2)
Bilirubin, Direct: <0.1 mg/dL (ref 0.00–0.40)
Total Protein: 7.1 g/dL (ref 6.0–8.5)

## 2022-10-30 ENCOUNTER — Telehealth: Payer: Self-pay | Admitting: Cardiology

## 2022-10-30 DIAGNOSIS — I2510 Atherosclerotic heart disease of native coronary artery without angina pectoris: Secondary | ICD-10-CM

## 2022-10-30 DIAGNOSIS — I10 Essential (primary) hypertension: Secondary | ICD-10-CM

## 2022-10-30 MED ORDER — EZETIMIBE 10 MG PO TABS: 10.0000 mg | ORAL_TABLET | Freq: Every day | ORAL | 3 refills | Status: DC

## 2022-10-30 NOTE — Telephone Encounter (Signed)
Patient is returning RN's call for his lab results. Please advise.  

## 2022-10-30 NOTE — Telephone Encounter (Signed)
Patient aware of lab results and provider recommendations. Zetia sent to pharmacy on file. Labs ordered. Verbalized understanding.

## 2022-10-31 ENCOUNTER — Telehealth: Payer: Self-pay | Admitting: Cardiology

## 2022-10-31 NOTE — Telephone Encounter (Signed)
*  STAT* If patient is at the pharmacy, call can be transferred to refill team.   1. Which medications need to be refilled? (please list name of each medication and dose if known)   ezetimibe (ZETIA) 10 MG tablet    2. Which pharmacy/location (including street and city if local pharmacy) is medication to be sent to?  [id number] - [place] ([place]), [place] - [address]   3. Do they need a 30 day or 90 day supply? 90  MED SENT TO INCORRECT PHARMACY, PLEASE SEND TO [address]

## 2022-11-02 ENCOUNTER — Telehealth: Payer: Self-pay | Admitting: *Deleted

## 2022-11-02 NOTE — Telephone Encounter (Signed)
-----   Message from Lewayne Bunting, MD sent at 10/27/2022  7:17 AM EDT ----- LDL not at goal; add zetia 10 mg daily; lipids and liver 8 weeks Olga Millers

## 2022-11-02 NOTE — Telephone Encounter (Signed)
Spoke with pt, he is currently taking zetia. Appointment made for patient to see the pharmacist to discuss options.

## 2022-11-03 NOTE — Telephone Encounter (Signed)
Spoke with patient. He confirmed that he was able to pick up Zetia 10mg  refill on 11/02/22. Updated pt's preferred [facility] to [facility] per his request. Pt denies questions or concerns at this time.

## 2022-12-08 ENCOUNTER — Ambulatory Visit: Payer: Commercial Managed Care - HMO

## 2023-01-11 ENCOUNTER — Ambulatory Visit: Payer: Commercial Managed Care - HMO | Attending: Cardiology

## 2023-01-11 NOTE — Progress Notes (Deleted)
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     History of Present Illness    Andrew Villarreal is a 62 y.o. male patient of Dr Jens Som, in the office today to discuss options for cholesterol management.  A coronary CT last December showed him to have a calcium score of 559.  Insurance Carrier: [employer]  LDL Cholesterol goal:    Current Medications:     Previously tried:    Family Hx:     Social Hx: Tobacco: Alcohol:      Diet:      Exercise:   Adherence Assessment  Do you ever forget to take your medication? [] Yes [] No  Do you ever skip doses due to side effects? [] Yes [] No  Do you have trouble affording your medicines? [] Yes [] No  Are you ever unable to pick up your medication due to transportation difficulties? [] Yes [] No  Do you ever stop taking your medications because you don't believe they are helping? [] Yes [] No  Do you check your weight daily? [] Yes [] No   Adherence strategy: ***  Barriers to obtaining medications: ***     Accessory Clinical Findings   Lab Results  Component Value Date   CHOL 158 10/26/2022   HDL 38 (L) 10/26/2022   LDLCALC 90 10/26/2022   TRIG 173 (H) 10/26/2022   CHOLHDL 4.2 10/26/2022    No results found for: "LIPOA"  Lab Results  Component Value Date   ALT 28 10/26/2022   AST 17 10/26/2022   ALKPHOS 94 10/26/2022    BILITOT 0.3 10/26/2022   Lab Results  Component Value Date   CREATININE 0.92 02/11/2020   BUN 6 02/11/2020   NA 140 02/11/2020   K 3.9 02/11/2020   CL 103 02/11/2020   CO2 27 02/11/2020   No results found for: "HGBA1C"  Home Medications    Current Outpatient Medications  Medication Sig Dispense Refill   albuterol (VENTOLIN HFA) 108 (90 Base) MCG/ACT inhaler Inhale 2 puffs into the lungs every 6 (six) hours as needed for wheezing or shortness of breath. 18 g 6   amLODipine-benazepril (LOTREL) 10-40 MG capsule Take 1 capsule by mouth once daily 30 capsule 0   aspirin EC 81 MG tablet Take 1 tablet (81 mg total) by mouth daily. Swallow whole. 90 tablet 3   atorvastatin (LIPITOR) 80 MG tablet Take 1 tablet (80 mg total) by mouth daily. 90 tablet 3   ezetimibe (ZETIA) 10 MG tablet Take 1 tablet (10 mg total) by mouth daily. 90 tablet 3   Fluticasone-Salmeterol (ADVAIR DISKUS) 100-50 MCG/DOSE AEPB 1 inhalation twice daily then brush teeth and tongue (Patient taking differently: Inhale 1 puff into the lungs 2 (two) times daily as needed (shortness of breath). brush teeth and tongue after use) 60 each 11   umeclidinium bromide (  INCRUSE ELLIPTA) 62.5 MCG/INH AEPB Inhale 1 puff into the lungs daily. 30 each 11   No current facility-administered medications for this visit.     Assessment & Plan    No problem-specific Assessment & Plan notes found for this encounter.   Phillips Hay, PharmD CPP [facility] [address]  [place], [place] [address] [phone]  01/11/2023, 7:24 AM

## 2023-01-12 ENCOUNTER — Encounter: Payer: Self-pay | Admitting: Family Medicine

## 2023-05-15 ENCOUNTER — Encounter (HOSPITAL_COMMUNITY): Payer: Self-pay

## 2023-05-15 ENCOUNTER — Ambulatory Visit (HOSPITAL_COMMUNITY)
Admission: EM | Admit: 2023-05-15 | Discharge: 2023-05-15 | Disposition: A | Discharge status: Home / Self Care | Payer: Commercial Managed Care - HMO | Attending: Family Medicine | Admitting: Family Medicine

## 2023-05-15 DIAGNOSIS — L309 Dermatitis, unspecified: Secondary | ICD-10-CM

## 2023-05-15 MED ORDER — PREDNISONE 20 MG PO TABS: 40.0000 mg | ORAL_TABLET | Freq: Every day | ORAL | 0 refills | Status: AC

## 2023-05-15 MED ORDER — TRIAMCINOLONE ACETONIDE 0.1 % EX CREA: 1.0000 | TOPICAL_CREAM | Freq: Two times a day (BID) | CUTANEOUS | 0 refills | Status: AC

## 2023-05-15 NOTE — ED Triage Notes (Signed)
Pt c/o rash to bilateral lower legs x1wk. C/o itching and pain. States using antibiotic creams with no relief.

## 2023-05-15 NOTE — Discharge Instructions (Addendum)
Triamcinolone cream--apply 2 times daily to the rash area until better, about 10 to 14 days.  Take prednisone 20 mg--2 daily for 5 days  Please follow-up with your primary care about the rash and also about the headaches you have been having.  Your blood pressure was normal today.

## 2023-05-15 NOTE — ED Provider Notes (Signed)
[facility]    CSN: [id number] Arrival date & time: 05/15/23  0801      History   Chief Complaint Chief Complaint  Patient presents with   Rash    HPI Andrew Villarreal is a 62 y.o. male.    Rash Here for pruritic rash on both shins. Began about 1 weeks ago. No f/c/shortness of breath.   No swelling in legs. Does often wear compression stockings, but had not worn them in about 3 months, until the last few days.  Does walk his dog and does wear shorts   Past Medical History:  Diagnosis Date   Asthma    COPD (chronic obstructive pulmonary disease) (HCC)    HTN (hypertension)    Hyperlipidemia    Migraine    Pneumonia 2020   Right temporal lobe mass    cyst   Seizures (HCC)    No seizure since 2017.  Right temporal lobe mass--stable for many years    Patient Active Problem List   Diagnosis Date Noted   Tobacco abuse counseling 01/22/2020   Right temporal lobe mass    Chronic obstructive pulmonary disease (HCC) 01/21/2020   COVID-19 01/21/2020   Lobar pneumonia (HCC) 01/21/2020   Dental decay 06/12/2018   Seizures (HCC)    Essential hypertension     Past Surgical History:  Procedure Laterality Date   ANTERIOR CERVICAL DECOMP/DISCECTOMY FUSION  02/18/2020   Procedure: ANTERIOR CERVICAL DECOMPRESSION/DISCECTOMY FUSION FIVE THROUGH SIX;  Surgeon: Venita Lick, MD;  Location: [facility] OR;  Service: Orthopedics;;   CARDIOVASCULAR STRESS TEST     normal       Home Medications    Prior to Admission medications   Medication Sig Start Date End Date Taking? Authorizing Provider  predniSONE (DELTASONE) 20 MG tablet Take 2 tablets (40 mg total) by mouth daily with breakfast for 5 days. 05/15/23 05/20/23 Yes Zenia Resides, MD  triamcinolone cream (KENALOG) 0.1 % Apply 1 Application topically 2 (two) times daily. To affected area till better 05/15/23  Yes Berneita Sanagustin, Janace Aris, MD  albuterol (VENTOLIN HFA) 108 (90 Base) MCG/ACT inhaler Inhale 2 puffs into  the lungs every 6 (six) hours as needed for wheezing or shortness of breath. 09/26/19   Julieanne Manson, MD  amLODipine-benazepril (LOTREL) 10-40 MG capsule Take 1 capsule by mouth once daily 01/31/21   Julieanne Manson, MD  aspirin EC 81 MG tablet Take 1 tablet (81 mg total) by mouth daily. Swallow whole. 07/10/22   Lewayne Bunting, MD  atorvastatin (LIPITOR) 80 MG tablet Take 1 tablet (80 mg total) by mouth daily. 07/10/22   Lewayne Bunting, MD  ezetimibe (ZETIA) 10 MG tablet Take 1 tablet (10 mg total) by mouth daily. 10/30/22 10/30/23  Lewayne Bunting, MD  Fluticasone-Salmeterol (ADVAIR DISKUS) 100-50 MCG/DOSE AEPB 1 inhalation twice daily then brush teeth and tongue Patient taking differently: Inhale 1 puff into the lungs 2 (two) times daily as needed (shortness of breath). brush teeth and tongue after use 09/26/19   Julieanne Manson, MD  umeclidinium bromide (INCRUSE ELLIPTA) 62.5 MCG/INH AEPB Inhale 1 puff into the lungs daily. 09/26/19   Julieanne Manson, MD  lisinopril (PRINIVIL,ZESTRIL) 20 MG tablet Take 1 tablet (20 mg total) by mouth daily. Patient not taking: Reported on 09/16/2019 06/24/18 12/05/19  Julieanne Manson, MD    Family History Family History  Problem Relation Age of Onset   Heart attack Father    Heart disease Father    Cancer Father  metastatic to bone.   Bipolar disorder Mother        Hospitalized frequently   Hypertension Sister    COPD Brother        history of smoking    Social History Social History   Tobacco Use   Smoking status: Former    Current packs/day: 0.00    Average packs/day: 1 pack/day for 38.0 years (38.0 ttl pk-yrs)    Types: Cigarettes    Start date: 01/21/1982    Quit date: 01/22/2020    Years since quitting: 3.3   Smokeless tobacco: Never  Vaping Use   Vaping status: Never Used  Substance Use Topics   Alcohol use: Yes    Comment: Rare   Drug use: Yes    Types: Marijuana    Comment: x3 weekly     Allergies    Carbamazepine, Clonazepam, Onion, and Zofran [ondansetron hcl]   Review of Systems Review of Systems  Skin:  Positive for rash.     Physical Exam Triage Vital Signs ED Triage Vitals  Encounter Vitals Group     BP 05/15/23 0815 129/82     Systolic BP Percentile --      Diastolic BP Percentile --      Pulse Rate 05/15/23 0815 70     Resp 05/15/23 0815 18     Temp 05/15/23 0815 98.8 F (37.1 C)     Temp Source 05/15/23 0815 Oral     SpO2 05/15/23 0815 96 %     Weight --      Height --      Head Circumference --      Peak Flow --      Pain Score 05/15/23 0816 3     Pain Loc --      Pain Education --      Exclude from Growth Chart --    No data found.  Updated Vital Signs BP 129/82 (BP Location: Left Arm)   Pulse 70   Temp 98.8 F (37.1 C) (Oral)   Resp 18   SpO2 96%   Visual Acuity Right Eye Distance:   Left Eye Distance:   Bilateral Distance:    Right Eye Near:   Left Eye Near:    Bilateral Near:     Physical Exam Vitals reviewed.  Constitutional:      General: He is not in acute distress.    Appearance: He is not ill-appearing, toxic-appearing or diaphoretic.  HENT:     Mouth/Throat:     Mouth: Mucous membranes are moist.  Eyes:     Extraocular Movements: Extraocular movements intact.     Conjunctiva/sclera: Conjunctivae normal.     Pupils: Pupils are equal, round, and reactive to light.  Cardiovascular:     Rate and Rhythm: Normal rate and regular rhythm.     Heart sounds: No murmur heard. Pulmonary:     Effort: Pulmonary effort is normal. No respiratory distress.     Breath sounds: Normal breath sounds. No stridor. No wheezing, rhonchi or rales.  Musculoskeletal:     Right lower leg: No edema.     Left lower leg: No edema.  Skin:    Coloration: Skin is not jaundiced or pale.     Comments: There is mildly erythematous papular rash scattered along his anterior shins bilaterally.  No drainage and no ulcerations there are some excoriated areas  where he has been scratching.  There is no sign of secondary infection  Neurological:  General: No focal deficit present.     Mental Status: He is alert and oriented to person, place, and time.  Psychiatric:        Behavior: Behavior normal.      UC Treatments / Results  Labs (all labs ordered are listed, but only abnormal results are displayed) Labs Reviewed - No data to display  EKG   Radiology No results found.  Procedures Procedures (including critical care time)  Medications Ordered in UC Medications - No data to display  Initial Impression / Assessment and Plan / UC Course  I have reviewed the triage vital signs and the nursing notes.  Pertinent labs & imaging results that were available during my care of the patient were reviewed by me and considered in my medical decision making (see chart for details).      Triamcinolone cream and 5 day burst prednisone is sent in for contact dermatitis.  Final Clinical Impressions(s) / UC Diagnoses   Final diagnoses:  Dermatitis     Discharge Instructions      Triamcinolone cream--apply 2 times daily to the rash area until better, about 10 to 14 days.  Take prednisone 20 mg--2 daily for 5 days  Please follow-up with your primary care about the rash and also about the headaches you have been having.  Your blood pressure was normal today.     ED Prescriptions     Medication Sig Dispense Auth. Provider   predniSONE (DELTASONE) 20 MG tablet Take 2 tablets (40 mg total) by mouth daily with breakfast for 5 days. 10 tablet Zenia Resides, MD   triamcinolone cream (KENALOG) 0.1 % Apply 1 Application topically 2 (two) times daily. To affected area till better 80 g Marlinda Mike Janace Aris, MD      PDMP not reviewed this encounter.   Zenia Resides, MD 05/15/23 [phone]

## 2023-05-27 NOTE — Progress Notes (Unsigned)
Cardiology Office Note:  .   Date:  05/28/2023  ID:  Andrew Villarreal, DOB 1961-07-23, MRN [id number] PCP: [name], DO  [facility] Providers Cardiologist: Olga Millers, MD  }   History of Present Illness: .   Andrew Villarreal is a 62 y.o. male we are following for ongoing assessment and management of coronary artery disease, coronary calcium score December 2023 559, nuclear medicine stress test on 07/18/2022 revealed low risk with only a very small area of ischemia in the basal inferior wall, aortic atherosclerosis, Hypertension, and hyperlipidemia.  Last seen in the office by Dr. Jens Som on 07/10/2022 and was stable from a cardiac standpoint.   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  He does have some chest tightness while driving.  Unfortunately continues to smoke a pack of cigarettes a day.  He states the tightness goes away on its own without any intervention.  Sometimes he pulls over on the side of the road and waits for it to pass.  The pain does not radiate.  He has been started on an inhaler by PCP.  He states that has been helpful.  He is not as physically active with purposeful exercise but he is back and forth between the houses and helping the elderly gentleman with her physical needs.  ROS: As above otherwise negative.  Studies Reviewed: .    NM Stress Test 07/18/2022  Findings are consistent with a very small area of  ischemia in the basal inferior wall. The study is low risk.   No ST deviation was noted.   LV perfusion is abnormal. Defect 1: There is a small defect with mild reduction in uptake present in the basal inferior location(s) that is reversible. There is normal wall motion in the defect area. Consistent with ischemia.   Left ventricular function is normal. Nuclear stress EF: 66 %. The left ventricular ejection fraction is hyperdynamic (>65%). End diastolic cavity  size is normal.   Patient exercised for 6 min and 33 sec. Maximum HR of 148 bpm. MPHR 92.0 %. Peak METS 7.8 .   Normal blood pressure response to exercise.    EKG Interpretation Date/Time:  Monday May 28 2023 10:11:03 EST Ventricular Rate:  64 PR Interval:  206 QRS Duration:  86 QT Interval:  386 QTC Calculation: 398 R Axis:   6  Text Interpretation: Normal sinus rhythm Nonspecific ST abnormality When compared with ECG of 16-Sep-2019 10:47, PREVIOUS ECG IS PRESENT Confirmed by Joni Reining [phone]) on 05/28/2023 10:32:44 AM    Physical Exam:   VS:  BP 104/72 (BP Location: Left Arm, Patient Position: Sitting, Cuff Size: Normal)   Pulse 64   Ht 5\' 10"  (1.778 m)   Wt 197 lb (89.4 kg)   SpO2 97%   BMI 28.27 kg/m     Wt Readings from Last 3 Encounters:  05/28/23 197 lb (89.4 kg)  07/18/22 191 lb (86.6 kg)  07/10/22 191 lb 9.6 oz (86.9 kg)    GEN: Well nourished, well developed in no acute distress NECK: No JVD; No carotid bruits CARDIAC: RRR, no murmurs, rubs, gallops RESPIRATORY:  Clear to auscultation in the upper lobes, crackles in the bases.  No wheezes or rhonchi. ABDOMEN: Soft, non-tender, non-distended EXTREMITIES:  No edema; No deformity   ASSESSMENT AND PLAN: .    Coronary artery disease: Nonobstructive per coronary CTA.  Calcium  score of 559.  Follow-up nuclear medicine stress test was low risk in 2023.  He continues to have some chest tightness which I believe is related to smoking.  He denies any chest pain with exertion.  If he has recurrence of chest tightness which is severe or longer lasting we will repeat a stress test and/or coronary CTA for diagnostic prognostic purposes.  2.  Hyperlipidemia: Goal of LDL less than 100.  Remains on atorvastatin 80 mg daily.  He has had recent labs by PCP.  Total cholesterol 101, LDL 46.  3.  Hypertension: Blood pressure is low normal.  He denies any issues of dizziness, near-syncope.  Will continue to monitor this.   May need to reduce amlodipine/benazepril if he is symptomatic.  4.  Ongoing tobacco abuse: He has been smoking a pack a day.  He would like to quit and is considering hypnosis.  I have spoke with him about the progression of CAD if he continues to smoke.  He is now using an inhaler which has been helping his chest tightness.         Signed, Bettey Mare. Liborio Nixon, ANP, AACC

## 2023-05-28 ENCOUNTER — Ambulatory Visit: Payer: Managed Care, Other (non HMO) | Attending: Adult Health | Admitting: Adult Health

## 2023-05-28 ENCOUNTER — Encounter: Payer: Self-pay | Admitting: Adult Health

## 2023-05-28 VITALS — BP 104/72 | HR 64 | Ht 70.0 in | Wt 197.0 lb

## 2023-05-28 DIAGNOSIS — Z716 Tobacco abuse counseling: Secondary | ICD-10-CM

## 2023-05-28 DIAGNOSIS — I10 Essential (primary) hypertension: Secondary | ICD-10-CM | POA: Diagnosis not present

## 2023-05-28 DIAGNOSIS — I2510 Atherosclerotic heart disease of native coronary artery without angina pectoris: Secondary | ICD-10-CM | POA: Insufficient documentation

## 2023-05-28 DIAGNOSIS — E7800 Pure hypercholesterolemia, unspecified: Secondary | ICD-10-CM

## 2023-05-28 NOTE — Patient Instructions (Signed)
Medication Instructions:  No Changes *If you need a refill on your cardiac medications before your next appointment, please call your pharmacy*   Lab Work: No Labs If you have labs (blood work) drawn today and your tests are completely normal, you will receive your results only by: MyChart Message (if you have MyChart) OR A paper copy in the mail If you have any lab test that is abnormal or we need to change your treatment, we will call you to review the results.   Testing/Procedures: No Testing   Follow-Up: At [facility], you and your health needs are our priority.  As part of our continuing mission to provide you with exceptional heart care, we have created designated Provider Care Teams.  These Care Teams include your primary Cardiologist (physician) and Advanced Practice Providers (APPs -  Physician Assistants and Nurse Practitioners) who all work together to provide you with the care you need, when you need it.  We recommend signing up for the patient portal called "MyChart".  Sign up information is provided on this After Visit Summary.  MyChart is used to connect with patients for Virtual Visits (Telemedicine).  Patients are able to view lab/test results, encounter notes, upcoming appointments, etc.  Non-urgent messages can be sent to your provider as well.   To learn more about what you can do with MyChart, go to [email or web address].    Your next appointment:   1 year(s)  Provider:   Olga Millers, MD

## 2023-06-06 ENCOUNTER — Other Ambulatory Visit: Payer: Self-pay | Admitting: Family Medicine

## 2023-06-06 DIAGNOSIS — F17200 Nicotine dependence, unspecified, uncomplicated: Secondary | ICD-10-CM

## 2023-06-19 ENCOUNTER — Encounter: Payer: Self-pay | Admitting: Family Medicine

## 2023-07-05 ENCOUNTER — Ambulatory Visit
Admission: RE | Admit: 2023-07-05 | Discharge: 2023-07-05 | Disposition: A | Discharge status: Home / Self Care | Payer: Commercial Managed Care - HMO | Source: Ambulatory Visit | Attending: Family Medicine | Admitting: Family Medicine

## 2023-07-05 DIAGNOSIS — F17200 Nicotine dependence, unspecified, uncomplicated: Secondary | ICD-10-CM

## 2023-07-30 ENCOUNTER — Other Ambulatory Visit: Payer: Self-pay

## 2023-07-30 MED ORDER — ATORVASTATIN CALCIUM 80 MG PO TABS: 80.0000 mg | ORAL_TABLET | Freq: Every day | ORAL | 3 refills | Status: DC

## 2023-11-13 ENCOUNTER — Other Ambulatory Visit: Payer: Self-pay

## 2023-11-13 DIAGNOSIS — I2510 Atherosclerotic heart disease of native coronary artery without angina pectoris: Secondary | ICD-10-CM

## 2023-11-13 DIAGNOSIS — I10 Essential (primary) hypertension: Secondary | ICD-10-CM

## 2023-11-13 MED ORDER — EZETIMIBE 10 MG PO TABS: 10.0000 mg | ORAL_TABLET | Freq: Every day | ORAL | 2 refills | Status: AC

## 2024-08-25 ENCOUNTER — Other Ambulatory Visit: Payer: Self-pay | Admitting: Cardiology
